# Patient Record
Sex: Female | Born: 1970 | Race: White | Hispanic: No | Marital: Married | Smoking: Never smoker
Health system: Southern US, Community
[De-identification: ages and names within clinical notes are randomized; demographics above are authoritative.]

## PROBLEM LIST (undated history)

## (undated) DIAGNOSIS — R51 Headache: Secondary | ICD-10-CM

## (undated) DIAGNOSIS — M8580 Other specified disorders of bone density and structure, unspecified site: Secondary | ICD-10-CM

## (undated) DIAGNOSIS — R519 Headache, unspecified: Secondary | ICD-10-CM

## (undated) DIAGNOSIS — M1612 Unilateral primary osteoarthritis, left hip: Secondary | ICD-10-CM

## (undated) DIAGNOSIS — IMO0002 Reserved for concepts with insufficient information to code with codable children: Secondary | ICD-10-CM

## (undated) DIAGNOSIS — J302 Other seasonal allergic rhinitis: Secondary | ICD-10-CM

## (undated) HISTORY — PX: WISDOM TOOTH EXTRACTION: SHX21

## (undated) HISTORY — DX: Unilateral primary osteoarthritis, left hip: M16.12

## (undated) HISTORY — PX: OTHER SURGICAL HISTORY: SHX169

## (undated) HISTORY — DX: Other seasonal allergic rhinitis: J30.2

## (undated) HISTORY — DX: Headache: R51

## (undated) HISTORY — DX: Headache, unspecified: R51.9

## (undated) HISTORY — DX: Other specified disorders of bone density and structure, unspecified site: M85.80

## (undated) HISTORY — DX: Reserved for concepts with insufficient information to code with codable children: IMO0002

---

## 1996-08-08 HISTORY — PX: BREAST ENHANCEMENT SURGERY: SHX7

## 1998-11-07 HISTORY — PX: BREAST ENHANCEMENT SURGERY: SHX7

## 2002-05-08 ENCOUNTER — Other Ambulatory Visit: Admission: RE | Admit: 2002-05-08 | Discharge: 2002-05-08 | Payer: Self-pay | Admitting: *Deleted

## 2003-09-18 ENCOUNTER — Other Ambulatory Visit: Admission: RE | Admit: 2003-09-18 | Discharge: 2003-09-18 | Payer: Self-pay | Admitting: *Deleted

## 2003-12-16 ENCOUNTER — Encounter: Admission: RE | Admit: 2003-12-16 | Discharge: 2003-12-16 | Payer: Self-pay | Admitting: Sports Medicine

## 2003-12-16 ENCOUNTER — Encounter: Admission: RE | Admit: 2003-12-16 | Discharge: 2003-12-16 | Payer: Self-pay | Admitting: Family Medicine

## 2004-01-23 ENCOUNTER — Encounter: Admission: RE | Admit: 2004-01-23 | Discharge: 2004-01-23 | Payer: Self-pay | Admitting: Family Medicine

## 2004-11-22 ENCOUNTER — Other Ambulatory Visit: Admission: RE | Admit: 2004-11-22 | Discharge: 2004-11-22 | Payer: Self-pay | Admitting: *Deleted

## 2006-02-22 ENCOUNTER — Other Ambulatory Visit: Admission: RE | Admit: 2006-02-22 | Discharge: 2006-02-22 | Payer: Self-pay | Admitting: Obstetrics & Gynecology

## 2006-03-15 ENCOUNTER — Encounter: Admission: RE | Admit: 2006-03-15 | Discharge: 2006-03-15 | Payer: Self-pay | Admitting: Obstetrics & Gynecology

## 2006-08-08 DIAGNOSIS — R87619 Unspecified abnormal cytological findings in specimens from cervix uteri: Secondary | ICD-10-CM

## 2006-08-08 DIAGNOSIS — IMO0002 Reserved for concepts with insufficient information to code with codable children: Secondary | ICD-10-CM

## 2006-08-08 HISTORY — DX: Reserved for concepts with insufficient information to code with codable children: IMO0002

## 2006-08-08 HISTORY — DX: Unspecified abnormal cytological findings in specimens from cervix uteri: R87.619

## 2006-08-08 HISTORY — PX: TUBAL LIGATION: SHX77

## 2006-10-07 LAB — CONVERTED CEMR LAB: Pap Smear: NORMAL

## 2006-10-30 ENCOUNTER — Ambulatory Visit: Payer: Self-pay | Admitting: Internal Medicine

## 2006-10-30 LAB — CONVERTED CEMR LAB
ALT: 14 units/L (ref 0–40)
AST: 14 units/L (ref 0–37)
Albumin: 4.2 g/dL (ref 3.5–5.2)
Alkaline Phosphatase: 76 units/L (ref 39–117)
BUN: 11 mg/dL (ref 6–23)
Basophils Absolute: 0 10*3/uL (ref 0.0–0.1)
Basophils Relative: 0.4 % (ref 0.0–1.0)
Bilirubin, Direct: 0.1 mg/dL (ref 0.0–0.3)
CO2: 32 meq/L (ref 19–32)
Calcium: 9 mg/dL (ref 8.4–10.5)
Chloride: 106 meq/L (ref 96–112)
Cholesterol: 154 mg/dL (ref 0–200)
Creatinine, Ser: 1 mg/dL (ref 0.4–1.2)
Eosinophils Absolute: 0.5 10*3/uL (ref 0.0–0.6)
Eosinophils Relative: 8.5 % — ABNORMAL HIGH (ref 0.0–5.0)
GFR calc Af Amer: 81 mL/min
GFR calc non Af Amer: 67 mL/min
Glucose, Bld: 87 mg/dL (ref 70–99)
HCT: 40.4 % (ref 36.0–46.0)
HDL: 45.7 mg/dL (ref >39.0)
Hemoglobin: 14 g/dL (ref 12.0–15.0)
LDL Cholesterol: 99 mg/dL (ref 0–99)
Lymphocytes Relative: 28 % (ref 12.0–46.0)
MCHC: 34.6 g/dL (ref 30.0–36.0)
MCV: 89.2 fL (ref 78.0–100.0)
Monocytes Absolute: 0.4 10*3/uL (ref 0.2–0.7)
Monocytes Relative: 6.9 % (ref 3.0–11.0)
Neutro Abs: 3.3 10*3/uL (ref 1.4–7.7)
Neutrophils Relative %: 56.2 % (ref 43.0–77.0)
Platelets: 228 10*3/uL (ref 150–400)
Potassium: 4 meq/L (ref 3.5–5.1)
RBC: 4.53 M/uL (ref 3.87–5.11)
RDW: 11.7 % (ref 11.5–14.6)
Sodium: 142 meq/L (ref 135–145)
TSH: 1.29 microintl units/mL (ref 0.35–5.50)
Total Bilirubin: 0.8 mg/dL (ref 0.3–1.2)
Total CHOL/HDL Ratio: 3.4
Total Protein: 6.9 g/dL (ref 6.0–8.3)
Triglycerides: 47 mg/dL (ref 0–149)
VLDL: 9 mg/dL (ref 0–40)
WBC: 5.9 10*3/uL (ref 4.5–10.5)

## 2006-11-06 ENCOUNTER — Ambulatory Visit: Payer: Self-pay | Admitting: Internal Medicine

## 2006-11-22 ENCOUNTER — Encounter: Admission: RE | Admit: 2006-11-22 | Discharge: 2006-11-22 | Payer: Self-pay | Admitting: Plastic Surgery

## 2007-01-07 HISTORY — PX: BREAST SURGERY: SHX581

## 2007-03-27 ENCOUNTER — Other Ambulatory Visit: Admission: RE | Admit: 2007-03-27 | Discharge: 2007-03-27 | Payer: Self-pay | Admitting: Obstetrics & Gynecology

## 2007-05-01 ENCOUNTER — Ambulatory Visit (HOSPITAL_BASED_OUTPATIENT_CLINIC_OR_DEPARTMENT_OTHER): Admission: RE | Admit: 2007-05-01 | Discharge: 2007-05-01 | Payer: Self-pay | Admitting: Obstetrics & Gynecology

## 2007-05-01 ENCOUNTER — Encounter: Payer: Self-pay | Admitting: Obstetrics & Gynecology

## 2007-09-26 ENCOUNTER — Other Ambulatory Visit: Admission: RE | Admit: 2007-09-26 | Discharge: 2007-09-26 | Payer: Self-pay | Admitting: Obstetrics & Gynecology

## 2007-11-06 LAB — CONVERTED CEMR LAB: Pap Smear: NORMAL

## 2008-08-22 ENCOUNTER — Ambulatory Visit: Payer: Self-pay | Admitting: *Deleted

## 2008-08-22 DIAGNOSIS — J329 Chronic sinusitis, unspecified: Secondary | ICD-10-CM | POA: Insufficient documentation

## 2008-09-15 ENCOUNTER — Ambulatory Visit: Payer: Self-pay | Admitting: *Deleted

## 2008-09-15 DIAGNOSIS — F341 Dysthymic disorder: Secondary | ICD-10-CM | POA: Insufficient documentation

## 2008-10-15 ENCOUNTER — Ambulatory Visit: Payer: Self-pay | Admitting: *Deleted

## 2008-11-14 ENCOUNTER — Ambulatory Visit: Payer: Self-pay | Admitting: *Deleted

## 2010-09-07 NOTE — Assessment & Plan Note (Signed)
Summary: talk about depression   Vital Signs:  Patient Profile:   40 Years Old Female Height:     61.25 inches Weight:      138 pounds BMI:     25.96 O2 treatment:    Room Air Temp:     98.0 degrees F oral Pulse rate:   84 / minute Pulse rhythm:   regular Resp:     20 per minute BP sitting:   110 / 70 Cuff size:   regular  Vitals Entered By: Darra Lis RMA (September 15, 2008 9:36 AM)  Menstrual History: LMP - Character: ablation                 Visit Type:  acute PCP:  Paulo Fruit MD  Chief Complaint:  Depression and Depressive symptoms.  History of Present Illness: Patient c/o depression/anxiety. Patient not enjoying the things she use to love to do. Past history of depression/anxiety - symptoms similar.    Depressive Symptoms      This is a 40 year old woman who presents with Depressive symptoms.  The patient reports depressed mood, loss of interest/pleasure, and hypersomnia, but denies significant weight loss, significant weight gain, insomnia, psychomotor agitation, and psychomotor retardation.  The patient also reports fatigue or loss of energy, feelings of worthlessness, diminished concentration, and indecisiveness.  The patient denies thoughts of death, thoughts of suicide, suicidal intent, and suicidal plans.  Patient's past history includes depression.  The patient denies abnormally elevated mood, abnormally irritable mood, decreased need for sleep, increased talkativeness, distractibility, flight of ideas, increased goal-directed activity, and inflated self-esteem/ grandiosity.      Updated Prior Medication List: OMEGA-3 350 MG  CAPS (OMEGA-3 FATTY ACIDS)  MULTIVITAMINS   TABS (MULTIPLE VITAMIN)  * B COMPLEX  * CALCIUM - D  Current Allergies (reviewed today): No known allergies   Past Medical History:    G3 P3    recurrent sinusitis    depression/anxiety  Past Surgical History:    Reviewed history from 08/22/2008 and no changes required:  breast augmentation 12/2006       Tubal ligation (04/2007)     Review of Systems       no nausea / vomiting / diarrhea / fever / chills / chest pain / SOB    Physical Exam  General:     Well-developed,well-nourished, well-hydrated, in no acute distress Eyes:     No conjunctival inflammation or injection noted. EOMI. PERRLA. Vision grossly normal. Ears:     External ear exam shows no significant lesions or deformities.   Hearing is grossly normal bilaterally. Nose:     External nasal examination shows no deformity or inflammation.  Neck:     supple, full ROM  Lungs:     Normal respiratory effort, chest expands symmetrically. Lungs are clear to auscultation, no crackles, rales  or wheezes. normal breath sounds and good air flow.   Heart:     Normal rate and regular rhythm. S1 and S2 normal without gallop, murmur, click, rub or other extra sounds. Extremities:     No clubbing, cyanosis, edema, or deformity noted with grossly normal full range of motion of all joints.   Neurologic:     alert & oriented X3 and gait normal.  no deficit in strength noted, no balance problems noted, neuro is grossly intact Skin:     Intact without suspicious lesions or rashes Psych:     Cognition and judgment appear intact. Alert and cooperative, normally interactive.  blunted affect noted     Impression & Recommendations:  Problem # 1:  DEPRESSION/ANXIETY (ICD-300.4) Patient with past history of similar symptoms.  patient denies suicidal/homocidal thoughts/plans.  safety contract made.  will start her on effexor and follow up in one month.  Patient to call if symptoms increase / change / persist or any concerns.    Had labs not long ago including TSH, CBC - reported as within normal limits    Complete Medication List: 1)  Omega-3 350 Mg Caps (Omega-3 fatty acids) 2)  Multivitamins Tabs (Multiple vitamin) 3)  B Complex  4)  Calcium - D  5)  Venlafaxine Hcl 37.5 Mg Tabs (Venlafaxine hcl)  .... One tab by mouth two times a day   Patient Instructions: 1)  Please schedule a follow-up appointment in 1 month.   Prescriptions: VENLAFAXINE HCL 37.5 MG TABS (VENLAFAXINE HCL) one tab by mouth two times a day  #60 x 1   Entered and Authorized by:   Paulo Fruit MD   Signed by:   Paulo Fruit MD on 09/15/2008   Method used:   Electronically to        [facility] [address] (retail)       [phone]       [place], [place]  [address]       Ph: [phone]       Fax: [phone]   RxID:   [id number]

## 2010-09-07 NOTE — Assessment & Plan Note (Signed)
Summary: 1 month rov-ch   Vital Signs:  Patient profile:   40 year old female Weight:      136.50 pounds BMI:     25.67 Temp:     98.4 degrees F oral Pulse rate:   70 / minute Pulse rhythm:   regular Resp:     16 per minute BP sitting:   100 / 60  (right arm) Cuff size:   regular  Vitals Entered By: Glendell Docker CMA (November 14, 2008 1:15 PM)  History of Present Illness: patient with newly diagnosis depression 09/15/08.  she was started on venlafaxine 37.5mg  two times a day and did well, but felt that there were still some minimal, residual symptoms.  She was increased to 75mg  two times a day at the last appointment, but she feels sleepy on the higher dose.  "I wish there was a dose right between the two."  We will try 37.5mg  - 2 in the am and 1 in the pm - for  a total of 3 / day.  She is agreeable.  no suicidal/homocidal thoughts/plans.    no other concerns.  Medications Prior to Update: 1)  Multivitamins   Tabs (Multiple Vitamin) 2)  B Complex 3)  Calcium - D 4)  Venlafaxine Hcl 75 Mg Tabs (Venlafaxine Hcl) .[name].. 1 By Mouth Two Times A Day  Allergies (verified): No Known Drug Allergies  Past History:  Past Medical History:    Reviewed history from 09/15/2008 and no changes required:    G3 P3    recurrent sinusitis    depression/anxiety  Past Surgical History:    Reviewed history from 08/22/2008 and no changes required:    breast augmentation 12/2006    Tubal ligation (04/2007)  Review of Systems       no nausea / vomiting / diarrhea / fever / chills / chest pain / SOB   Physical Exam  Additional Exam:  General:     Well-developed,well-nourished, well-hydrated, in no acute distress Eyes:     No conjunctival inflammation or injection noted. EOMI. PERRLA. Vision grossly normal. Ears:     External ear exam shows no significant lesions or deformities.   Hearing is grossly normal bilaterally. Nose:     External nasal examination shows no deformity or inflammation.    Neck:     supple, full ROM  Lungs:     Normal respiratory effort, chest expands symmetrically. Lungs are clear to auscultation, no crackles, rales  or wheezes. normal breath sounds and good air flow.   Heart:     Normal rate and regular rhythm. S1 and S2 normal without gallop, murmur, click, rub or other extra sounds. Extremities:     No clubbing, cyanosis, edema, or deformity noted with grossly normal full range of motion of all joints.   Neurologic:     alert & oriented X3 and gait normal.  no deficit in strength noted, no balance problems noted, neuro is grossly intact Skin:     Intact without suspicious lesions or rashes Psych:     Cognition and judgment appear intact. Alert and cooperative, normally interactive.  brighter  affect noted    Impression & Recommendations:  Problem # 1:  DEPRESSION/ANXIETY (ICD-300.4) venlafaxine 37.5mg  - 2 in am and 1 in pm - for a total of 3 / day. see HPI for discussion. follow up in 3 months Patient to call if symptoms increase / change / persist or any concerns.   Complete Medication List: 1)  Multivitamins Tabs (Multiple vitamin) 2)  B Complex  3)  Calcium - D  4)  Venlafaxine Hcl 37.5 Mg Tabs (Venlafaxine hcl) .... Two tabs by mouth every am, and one tab by mouth every pm  Patient Instructions: 1)  Please schedule a follow-up appointment in 3 months .  Prescriptions: VENLAFAXINE HCL 37.5 MG TABS (VENLAFAXINE HCL) two tabs by mouth every am, and one tab by mouth every pm  #90 x 3   Entered and Authorized by:   Paulo Fruit MD   Signed by:   Paulo Fruit MD on 11/14/2008   Method used:   Electronically to        [facility] [address] (retail)       [phone]       [place], [place]  [address]       Ph: [phone]       Fax: [phone]   RxID:   [id number]       Current Allergies (reviewed today): No known allergies

## 2010-09-07 NOTE — Assessment & Plan Note (Signed)
Summary: 1 months rov-ch   Vital Signs:  Patient profile:   40 year old female Height:      61.25 inches Weight:      135 pounds BMI:     25.39 Temp:     98.1 degrees F oral Pulse rate:   72 / minute Pulse rhythm:   regular Resp:     20 per minute BP sitting:   102 / 62  (right arm) Cuff size:   regular  Vitals Entered By: Darra Lis RMA (October 15, 2008 1:07 PM) CC: follow up   History of Present Illness: Patient is doing well on the Effexor but thinks she needs to go up on the dose.  no complaints of side effects.  Depressive symptoms much better, but not fully relieved.  no suicidal/homocidal thoughts/plans.     Allergies (verified): No Known Drug Allergies  Past Medical History:    Reviewed history from 09/15/2008 and no changes required:       G3 P3       recurrent sinusitis       depression/anxiety  Past Surgical History:    Reviewed history from 08/22/2008 and no changes required:       breast augmentation 12/2006       Tubal ligation (04/2007)  Review of Systems       no nausea / vomiting / diarrhea / fever / chills / chest pain / SOB   Physical Exam  Additional Exam:  General:     Well-developed,well-nourished, well-hydrated, in no acute distress Eyes:     No conjunctival inflammation or injection noted. EOMI. PERRLA. Vision grossly normal. Ears:     External ear exam shows no significant lesions or deformities.   Hearing is grossly normal bilaterally. Nose:     External nasal examination shows no deformity or inflammation.  Neck:     supple, full ROM  Lungs:     Normal respiratory effort, chest expands symmetrically. Lungs are clear to auscultation, no crackles, rales  or wheezes. normal breath sounds and good air flow.   Heart:     Normal rate and regular rhythm. S1 and S2 normal without gallop, murmur, click, rub or other extra sounds. Extremities:     No clubbing, cyanosis, edema, or deformity noted with grossly normal full range of motion of  all joints.   Neurologic:     alert & oriented X3 and gait normal.  no deficit in strength noted, no balance problems noted, neuro is grossly intact Skin:     Intact without suspicious lesions or rashes Psych:     Cognition and judgment appear intact. Alert and cooperative, normally interactive.  brighter  affect noted    Impression & Recommendations:  Problem # 1:  DEPRESSION/ANXIETY (ICD-300.4) will increase meds and follow up in one month  Complete Medication List: 1)  Multivitamins Tabs (Multiple vitamin) 2)  B Complex  3)  Calcium - D  4)  Venlafaxine Hcl 75 Mg Tabs (Venlafaxine hcl) .[name].. 1 by mouth two times a day  Patient Instructions: 1)  Please schedule a follow-up appointment in 1 month.  2)  The medication list was reviewed and reconciled.  All changed / newly prescribed medications were explained.  A complete medication list was provided to the patient / caregiver. Prescriptions: VENLAFAXINE HCL 75 MG TABS (VENLAFAXINE HCL) 1 by mouth two times a day  #60 x 1   Entered and Authorized by:   Paulo Fruit MD   Signed by:  Paulo Fruit MD on 10/15/2008   Method used:   Electronically to        [facility] [address] (retail)       [phone]       [place], [place]  [address]       Ph: [phone]       Fax: [phone]   RxID:   [id number]

## 2010-09-07 NOTE — Assessment & Plan Note (Signed)
Summary: NEW PT TO BE EST. & ? SINUS INFECTION   Vital Signs:  Patient Profile:   40 Years Old Female Height:     61.25 inches Weight:      138.12 pounds BMI:     25.98 Temp:     98.1 degrees F oral Pulse rate:   60 / minute Pulse rhythm:   regular Resp:     20 per minute BP sitting:   100 / 70  (right arm) Cuff size:   regular  Vitals Entered By: Shary Decamp (August 22, 2008 2:10 PM)                 Visit Type:  new patient - acute PCP:  Paulo Fruit MD  Chief Complaint:  sinusitis and URI symptoms.  History of Present Illness: Patient is usually seen at another [place] facility, but is transferring here.  patient started with cold sxs 2 weeks ago -- now c/o of increasing sinus pain & pressure for past 3 days ; using benadryl -- tyl cold.  patient with history of recurrent sinusitis - z-paks have worked well in the past.  The patient reports nasal congestion, purulent nasal discharge, and earache, but denies sore throat, dry cough, and productive cough.  The patient denies fever, stiff neck, dyspnea, wheezing, rash, vomiting, and diarrhea.  The patient also reports sneezing and headache.  The patient denies itchy watery eyes, itchy throat, muscle aches, and severe fatigue.      Updated Prior Medication List: OMEGA-3 350 MG  CAPS (OMEGA-3 FATTY ACIDS)  MULTIVITAMINS   TABS (MULTIPLE VITAMIN)  * B COMPLEX  * CALCIUM - D   Current Allergies (reviewed today): No known allergies   Past Medical History:    G3 P3    recurrent sinusitis  Past Surgical History:    breast augmentation 12/2006    Tubal ligation (04/2007)   Family History:    Family History of  colon cancer    Family History of  diabetes  Social History:    Occupation: Mudlogger    Married    3 children    Former Smoker    Alcohol use-no   Risk Factors:  Tobacco use:  quit    Year quit:  1991    Pack-years:  2 years 1/2 ppd Passive smoke exposure:  yes Drug use:   no HIV high-risk behavior:  no Caffeine use:  2 drinks per day Alcohol use:  no Exercise:  yes    Times per week:  5    Type:  cardio, weights Seatbelt use:  100 % Sun Exposure:  frequently - uses sun block  Family History Risk Factors:    Family History of MI in females < 65 years old:  no    Family History of MI in males < 40 years old:  no  PAP Smear History:     Date of Last PAP Smear:  11/06/2007    Results:  Normal   Mammogram History:     Date of Last Mammogram:  12/07/2006    Results:  normal   PAP Smear History:     Date of Last PAP Smear:  10/07/2006    Results:  normal    Review of Systems       no nausea / vomiting / diarrhea / fever / chills / chest pain / SOB    Physical Exam  General:     Well-developed,well-nourished, well-hydrated, in no acute distress,  tired/ill  appearing, but non-toxic Eyes:     No conjunctival inflammation or injection noted. EOMI. PERRLA. Vision grossly normal. Ears:     External ear exam shows no significant lesions or deformities.  Otoscopic examination reveals clear canals, tympanic membranes are intact bilaterally without bulging, retraction, inflammation or discharge. Hearing is grossly normal bilaterally. Nose:     External nasal examination shows no deformity or inflammation. Nasal mucosa are pink and moist without lesions or exudates. pain to palpation of the sinuses Mouth:     Oral mucosa and oropharynx without lesions, erythema or exudates.  some  postnasal drip Neck:     supple, full ROM  Lungs:     Normal respiratory effort, chest expands symmetrically. Lungs are clear to auscultation, no crackles, rales  or wheezes. normal breath sounds and good air flow.   Heart:     Normal rate and regular rhythm. S1 and S2 normal without gallop, murmur, click, rub or other extra sounds. Extremities:     No clubbing, cyanosis, edema, or deformity noted with grossly normal full range of motion of all joints.   Neurologic:      alert & oriented X3 and gait normal.  no deficit in strength noted, no balance problems noted, neuro is grossly intact Skin:     Intact without suspicious lesions or rashes Psych:     Cognition and judgment appear intact. Alert and cooperative, normally interactive     Impression & Recommendations:  Problem # 1:  SINUSITIS (ICD-473.9) will treat with z-pak since has worked well in the past.  also gave [employer]-pot handout.  Get plenty of rest, drink lots of clear liquids, and use Tylenol or Ibuprofen for fever and comfort. If sinus symptoms, then use warm moist compresses, and over the counter decongestants (only as directed). Call if no improvement in 5-7 days, sooner if increasing pain, fever, or new symptoms.  Her updated medication list for this problem includes:    Zithromax Z-pak 250 Mg Tabs (Azithromycin) .[name]... As directed   Complete Medication List: 1)  Omega-3 350 Mg Caps (Omega-3 fatty acids) 2)  Multivitamins Tabs (Multiple vitamin) 3)  B Complex  4)  Calcium - D  5)  Zithromax Z-pak 250 Mg Tabs (Azithromycin) .... As directed    Prescriptions: ZITHROMAX Z-PAK 250 MG TABS (AZITHROMYCIN) as directed  #1 x 0   Entered and Authorized by:   Paulo Fruit MD   Signed by:   Paulo Fruit MD on 08/22/2008   Method used:   Electronically to        [facility] [address] (retail)       [phone]       [place], [place]  [address]       Ph: [phone]       Fax: [phone]   RxID:   [id number]    Preventive Care Screening  Mammogram:    Date:  12/07/2006    Results:  normal   Pap Smear:    Date:  10/07/2006    Results:  normal

## 2010-12-21 NOTE — Op Note (Signed)
NAMEDEONNA, KRUMMEL                ACCOUNT NO.:  [id number]   MEDICAL RECORD NO.:  [id number]        PATIENT TYPE:  HAMB   LOCATION:                               FACILITY:  [facility]   PHYSICIAN:  M. Leda Quail, MD  DATE OF BIRTH:  Feb 02, 1971   DATE OF PROCEDURE:  05/01/2007  DATE OF DISCHARGE:                               OPERATIVE REPORT   PREOPERATIVE DIAGNOSES:  53. A 40 year old G2, P2 white female with history of desired      sterility.  2. Dysmenorrhea with current Mirena intrauterine contraceptive device      in place.  3. History of dysfunctional uterine bleeding in the past.   POSTOPERATIVE DIAGNOSES:  67. A 40 year old G2, P2 white female with history of desired      sterility.  2. Dysmenorrhea with current Mirena intrauterine contraceptive device      in place.  3. History of dysfunctional uterine bleeding in the past.   PROCEDURES:  1. Endometrial biopsy and Mirena intrauterine contraceptive device      removal.  2. HTA.  3. Laparoscopic bilateral tubal ligation with Filshie clips.   SURGEON:  M. Leda Quail, MD   ASSISTANT:  OR staff.   ANESTHESIA:  General endotracheal.   FINDINGS:  Normal uterus, tubes, ovaries, liver, and gallbladder.   SPECIMENS:  Endometrial biopsy to pathology.   ESTIMATED BLOOD LOSS:  Minimal.   FLUIDS:  1200 mL of LR.   URINE OUTPUT:  50 mL drained with a Foley catheter at the beginning of  the procedure.   COMPLICATIONS:  None.   INDICATIONS:  Ms. Rana Snare is a very nice 40 year old G2 P2 female who has a  history of long-term Depo-Provera use.  She initially started this due  to a history of DUB, and desired cycle regulation birth control.  She  has been on Depo-Provera since 1998.  About a year ago we decided to  discontinue the Depo-Provera and to use a Mirena IUD for birth control.  She really likes the amenorrhea that she has had with the Depo-Provera,  and I felt the Mirena was the next best option for her.  This  was placed  but since that time she has had continued to have pelvic pain and  cramping.  She does not have endometriosis, and she has waited a  completely appropriate amount of time to see if this will again improve.  She desired to have the Mirena IUD and did not desire going back on the  Depo-Provera.  She likes the way she feels without it better.  She does  not want to use birth control pills; and,  therefore, we talked about  permanent sterilization.  She was ready to proceed with this, but did  not want to have any new bleeding if at all possible.  Because of her  history, we talked about doing an endometrial ablation.  She educated  herself with the material  I provided, and with office counseling, and  decided to go ahead and proceed with both surgical interventions at the  same time.  The patient has had  a preop visit in the clinic with risks  and benefits explained, and she presents today for IUD removal  endometrial biopsy, HTA, and laparoscopic tubal ligation.   DESCRIPTION OF PROCEDURE:  The patient is taken to the operating room.  She is placed in the supine position.  General endotracheal anesthesia  is administered by the anesthesia staff without difficulty.  The left  arm is tucked.  Shoulders ports are added to the top the table for when  the patient is placed in Trendelenburg.  Her legs were then positioned  in the low lithotomy position in [place] stirrups.  Abdomen, perineum,  inner thighs, and vagina were prepped and draped in normal sterile  fashion.  A red rubber Foley catheter is used to drain the bladder of  all urine.   The legs are then lifted to the high lithotomy position, and attention  is turned toward the vagina.  A bivalve speculum was placed in the  vagina.  The anterior lip of the cervix was grasped with a single-tooth  tenaculum.  Then 10 mL of 1% lidocaine is instilled circumferentially  around the cervix at the 3, 6, 9, and 12 o'clock positions.   The uterus  then sounds to about 7.5 cm.  Pratt dilators are used to dilate the  cervix up to a #21.  Attempt is then made to pass the hysteroscope with  the HCA apparatus attached.  This did not pass,  and the cervix was  dilated up to a #23.   The cervix had a very rubbery consistency, and I was doubtful that  dilation to #21 would work.  Once the #23 [facility] dilator was placed  cervically, the hysteroscope could be slowly passed through the cervix.  There did appear to be excellent seal present.  Photo documentation of  the uterine cavity and tubal ostia were performed.  Hysteroscopic media  for this procedure was LR.   Once the scope was placed in the correct positioning within the cavity.  The hysteroscopic fluid was then heated slowly over 2 minutes to 90-  degrees Celsius.  The reservoir was completely stable during this  process.  Once the fluid was heated to about 90-degrees Celsius, a 10-  minute ablation cycle was performed.  There was no fluid leaking at all  from the cervix.  There was a very slow 10 mL of fluid leak over a 7-1/2  minutes time frame.  I decided to go ahead and complete the ablation  cycle, because I did not feel that this was coming from the cervix.  The  uterine cavity did relax during the ablation cycle, and I felt that it  was primarily due to this.  The full 10-minute ablation was performed  with the reservoir being completely stable for the last 2-1/2 minutes.  Fluid loss for the entire procedure was 10 mL.   Once the ablation cycle was complete, good blanching of the endometrial  tissue was noted.  Photo documentation was made.  Cool cycle was  performed, and then the hysteroscope was removed.  Tenaculum was removed  for the anterior lip of the cervix, and the speculum was removed from  vagina.  A sponge stick was then placed in the vagina to lift the uterus  for the laparoscopic portion of the procedure.  The legs were then  lowered.  At this point,  sponge, lap, and instrument counts were correct  x2.  There were no needles used on the field at this  point.   Attention was turned to the umbilicus, 3 mL of 1/4% Marcaine were  instilled beneath the umbilicus.  A 10-mm skin incision was made with  the #11 blade.  The subcu fat tissue was dissected with the hemostat.  The sacral promontory was palpated.  The abdominal wall was then lifted  and a Veress needle was placed through the abdominal wall layers aiming  toward the pelvis below the level of the sacral promontory.  Normal  saline was attached to the Veress needle, and aspiration was performed  without any blood or fluid noted.  Then an injection was performed, and  then an aspiration, again, was performed without any blood fluid or  saline being aspirated.   CO2 gas was attached to the Veress needle under low flow and a  pneumoperitoneum was achieved without difficulty.  Pressures remained  under 12 mmHg, and were mostly under 8 mmHg until the very end when  there was approximately 2-1/2 liters of gas in the abdomen.  The Veress  needle was then removed..  The abdominal wall was, again, grasped and an  OptiVu port attached to the straight laparoscope was then advanced into  the abdomen using a twisting motion to divide the abdominal wall layers.  Once intraperitoneal placement was confirmed, a straight scope was  changed to an operative scope.   The uterus was lifted, the fallopian tubes and ovaries were noted  bilaterally.  The upper abdomen was surveyed.  There was no  abnormalities noted.  Then a Filshie clip was placed on both the right-  and-left fallopian tubes in the isthmic region without difficulty.  Photo documentation was made of this.  While this portion of the  procedure was performed, the patient was in some mild Trendelenburg.  The Trendelenburg position was then relieved.  The laparoscope was  removed, and the pneumoperitoneum was completely relieved.  Several   breaths from the anesthesiologist were given to the patient to try and  get all of the gas of the abdomen.   Then the port was removed, and the operator's index finger was placed in  the incision to ensure that there was no bowel present.  The fascia was  then grasped on each side with a Kocher clamp.  A figure-of-eight suture  of #0 Vicryl was used to close the fascial incision at the umbilicus.  Then a subcuticular stitch of 3-0 Vicryl was used to close the skin.  The incision was cleansed, Dermabond was placed on the incision.   Sponge, lap, needle, and instrument counts were correct x2.  The patient  tolerated the procedure very well.  She was positioned, placed back in  the supine position, with her legs removed from the Joshua stirrups; and  she was awakened from anesthesia without difficulty.  The patient  tolerated the procedure well, and she was taken to the recovery room in  stable condition.      Lum Keas, MD  Electronically Signed     MSM/MEDQ  D:  05/01/2007  T:  05/02/2007  Job:  [id number]

## 2011-05-19 LAB — URINE MICROSCOPIC-ADD ON

## 2011-05-19 LAB — URINALYSIS, ROUTINE W REFLEX MICROSCOPIC
Bilirubin Urine: NEGATIVE
Glucose, UA: NEGATIVE
Hgb urine dipstick: NEGATIVE
Ketones, ur: NEGATIVE
Nitrite: POSITIVE — AB
Protein, ur: NEGATIVE
Specific Gravity, Urine: 1.018
Urobilinogen, UA: 0.2
pH: 5.5

## 2011-05-19 LAB — CBC
HCT: 35.7 — ABNORMAL LOW
Hemoglobin: 12.3
MCHC: 34.5
MCV: 89.1
Platelets: 246
RBC: 4.01
RDW: 11.7
WBC: 8.4

## 2011-05-19 LAB — PREGNANCY, URINE: Preg Test, Ur: NEGATIVE

## 2013-04-26 ENCOUNTER — Encounter: Payer: Self-pay | Admitting: Obstetrics & Gynecology

## 2013-04-29 ENCOUNTER — Ambulatory Visit (INDEPENDENT_AMBULATORY_CARE_PROVIDER_SITE_OTHER): Payer: Managed Care, Other (non HMO) | Admitting: Obstetrics & Gynecology

## 2013-04-29 ENCOUNTER — Encounter: Payer: Self-pay | Admitting: Obstetrics & Gynecology

## 2013-04-29 VITALS — BP 100/60 | HR 60 | Resp 16 | Ht 61.5 in | Wt 130.4 lb

## 2013-04-29 DIAGNOSIS — Z01419 Encounter for gynecological examination (general) (routine) without abnormal findings: Secondary | ICD-10-CM

## 2013-04-29 DIAGNOSIS — Z124 Encounter for screening for malignant neoplasm of cervix: Secondary | ICD-10-CM

## 2013-04-29 DIAGNOSIS — N83209 Unspecified ovarian cyst, unspecified side: Secondary | ICD-10-CM

## 2013-04-29 DIAGNOSIS — Z0000 Encounter for general adult medical examination without abnormal findings: Secondary | ICD-10-CM

## 2013-04-29 LAB — HEMOGLOBIN, FINGERSTICK: Hemoglobin, fingerstick: 12.6 g/dL (ref 12.0–16.0)
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
## 2013-04-29 NOTE — Progress Notes (Signed)
PUS scheduled for 05-02-13.

## 2013-04-29 NOTE — Patient Instructions (Signed)

## 2013-04-30 ENCOUNTER — Telehealth: Payer: Self-pay | Admitting: Obstetrics & Gynecology

## 2013-04-30 LAB — COMPREHENSIVE METABOLIC PANEL
ALT: 33 U/L (ref 0–35)
AST: 37 U/L (ref 0–37)
Albumin: 4.2 g/dL (ref 3.5–5.2)
Alkaline Phosphatase: 58 U/L (ref 39–117)
BUN: 12 mg/dL (ref 6–23)
CO2: 29 mEq/L (ref 19–32)
Calcium: 8.9 mg/dL (ref 8.4–10.5)
Chloride: 102 mEq/L (ref 96–112)
Creat: 0.8 mg/dL (ref 0.50–1.10)
Glucose, Bld: 90 mg/dL (ref 70–99)
Potassium: 3.8 mEq/L (ref 3.5–5.3)
Sodium: 137 mEq/L (ref 135–145)
Total Bilirubin: 0.3 mg/dL (ref 0.3–1.2)
Total Protein: 6.7 g/dL (ref 6.0–8.3)

## 2013-04-30 LAB — LIPID PANEL
Cholesterol: 157 mg/dL (ref 0–200)
HDL: 66 mg/dL (ref >39)
LDL Cholesterol: 76 mg/dL (ref 0–99)
Total CHOL/HDL Ratio: 2.4 Ratio
Triglycerides: 74 mg/dL (ref <150)
VLDL: 15 mg/dL (ref 0–40)

## 2013-04-30 LAB — VITAMIN D 25 HYDROXY (VIT D DEFICIENCY, FRACTURES): Vit D, 25-Hydroxy: 55 ng/mL (ref 30–89)

## 2013-04-30 LAB — TSH: TSH: 0.753 u[IU]/mL (ref 0.350–4.500)

## 2013-04-30 NOTE — Telephone Encounter (Signed)
LMTCB to move PUS appt up.  °

## 2013-05-01 ENCOUNTER — Telehealth: Payer: Self-pay | Admitting: Obstetrics & Gynecology

## 2013-05-01 NOTE — Telephone Encounter (Signed)
LMTCB (mobile number) advised PUS for 9/25 has been cancelled and to call back to r/s. Advised that 9am appointment is available tmro.  Home number rang busy.  LVM on work number.

## 2013-05-02 ENCOUNTER — Other Ambulatory Visit: Payer: Self-pay

## 2013-05-02 ENCOUNTER — Other Ambulatory Visit: Payer: Self-pay | Admitting: Obstetrics & Gynecology

## 2013-05-02 LAB — IPS PAP SMEAR ONLY

## 2013-05-02 NOTE — Telephone Encounter (Signed)
Spoke with patient and rescheduled PUS for 9/30.

## 2013-05-07 ENCOUNTER — Encounter: Payer: Self-pay | Admitting: Obstetrics & Gynecology

## 2013-05-07 ENCOUNTER — Ambulatory Visit (INDEPENDENT_AMBULATORY_CARE_PROVIDER_SITE_OTHER): Payer: Managed Care, Other (non HMO) | Admitting: Obstetrics & Gynecology

## 2013-05-07 ENCOUNTER — Ambulatory Visit (INDEPENDENT_AMBULATORY_CARE_PROVIDER_SITE_OTHER): Payer: Managed Care, Other (non HMO)

## 2013-05-07 DIAGNOSIS — N83209 Unspecified ovarian cyst, unspecified side: Secondary | ICD-10-CM

## 2013-05-07 DIAGNOSIS — N8000 Endometriosis of the uterus, unspecified: Secondary | ICD-10-CM

## 2013-05-07 DIAGNOSIS — N8003 Adenomyosis of the uterus: Secondary | ICD-10-CM

## 2013-05-07 NOTE — Progress Notes (Signed)
42 y.o.Marriedfemale here for a pelvic ultrasound due to enlarged left ovary palpated with AEX on 04/29/13.  Pt denies pain.  No vaginal bleeding.  No complaints.  Patient's last menstrual period was 08/08/2005.  Sexually active:  yes  Contraception: bilateral tubal ligation  FINDINGS: UTERUS: 7.7 x 4.8 x 3.6cm with adenomyosis appearing endometrium EMS: 2.87mm ADNEXA:   Left ovary 3.6 x 2.5 x 2.1cm with 2cm corpus luteal cyst   Right ovary 3.6 x 2.5 x 2.1cm with 2 simple cysts CUL DE SAC: no free fluid   Images reviewed with patient.  Discussed findings of cyst and corpus luteal cyst.  All questions answered.  Assessment:  Adenomyosis and corpus luteal cyst Plan: F/U 1 year for AEX and prn new problems  ~15 minutes spent with patient >50% of time was in face to face discussion of above.

## 2013-10-06 HISTORY — PX: OTHER SURGICAL HISTORY: SHX169

## 2013-10-06 HISTORY — PX: VAGINA RECONSTRUCTION SURGERY: SHX828

## 2013-12-04 ENCOUNTER — Ambulatory Visit: Payer: Self-pay | Admitting: Physician Assistant

## 2013-12-04 DIAGNOSIS — Z0289 Encounter for other administrative examinations: Secondary | ICD-10-CM

## 2014-06-09 ENCOUNTER — Encounter: Payer: Self-pay | Admitting: Obstetrics & Gynecology

## 2014-09-08 ENCOUNTER — Telehealth: Payer: Self-pay | Admitting: Nurse Practitioner

## 2014-11-25 ENCOUNTER — Ambulatory Visit (INDEPENDENT_AMBULATORY_CARE_PROVIDER_SITE_OTHER): Payer: Managed Care, Other (non HMO) | Admitting: Obstetrics & Gynecology

## 2014-11-25 ENCOUNTER — Encounter: Payer: Self-pay | Admitting: Obstetrics & Gynecology

## 2014-11-25 VITALS — BP 102/62 | HR 64 | Resp 12 | Ht 61.5 in | Wt 133.6 lb

## 2014-11-25 DIAGNOSIS — Z0000 Encounter for general adult medical examination without abnormal findings: Secondary | ICD-10-CM | POA: Diagnosis not present

## 2014-11-25 DIAGNOSIS — Z124 Encounter for screening for malignant neoplasm of cervix: Secondary | ICD-10-CM

## 2014-11-25 DIAGNOSIS — Z01419 Encounter for gynecological examination (general) (routine) without abnormal findings: Secondary | ICD-10-CM

## 2014-11-25 LAB — COMPREHENSIVE METABOLIC PANEL
ALT: 20 U/L (ref 0–35)
AST: 22 U/L (ref 0–37)
Albumin: 4.4 g/dL (ref 3.5–5.2)
Alkaline Phosphatase: 54 U/L (ref 39–117)
BUN: 16 mg/dL (ref 6–23)
CO2: 26 mEq/L (ref 19–32)
Calcium: 9.1 mg/dL (ref 8.4–10.5)
Chloride: 102 mEq/L (ref 96–112)
Creat: 0.9 mg/dL (ref 0.50–1.10)
Glucose, Bld: 98 mg/dL (ref 70–99)
Potassium: 4.2 mEq/L (ref 3.5–5.3)
Sodium: 138 mEq/L (ref 135–145)
Total Bilirubin: 0.2 mg/dL (ref 0.2–1.2)
Total Protein: 7 g/dL (ref 6.0–8.3)

## 2014-11-25 LAB — LIPID PANEL
Cholesterol: 164 mg/dL (ref 0–200)
HDL: 66 mg/dL (ref >=46)
LDL Cholesterol: 84 mg/dL (ref 0–99)
Total CHOL/HDL Ratio: 2.5 Ratio
Triglycerides: 72 mg/dL (ref <150)
VLDL: 14 mg/dL (ref 0–40)

## 2014-11-25 LAB — POCT URINALYSIS DIPSTICK
Bilirubin, UA: NEGATIVE
Glucose, UA: NEGATIVE
Ketones, UA: NEGATIVE
Nitrite, UA: NEGATIVE
Urobilinogen, UA: NEGATIVE
pH, UA: 5

## 2014-11-25 LAB — TSH: TSH: 1.307 u[IU]/mL (ref 0.350–4.500)

## 2014-11-25 NOTE — Progress Notes (Signed)
44 y.o. G3P3 MarriedCaucasianF here for annual exam.  Doing well.  Got married last year!!  Husband in [place].  He will be home in June.  Pt reports about a year ago.  Went to physician in [place].  Can't remember his name.  Has not had any bladder or bowel issues.  Had follow up monthly.    Continues to not have menstrual cycles.      No LMP recorded. Patient has had an ablation.          Sexually active: Yes.    The current method of family planning is tubal ligation.    Exercising: Yes.    weights and cardio Smoker:  no  Health Maintenance: Pap:  04/29/13 WNL History of abnormal Pap:  yes MMG:  4/08-normal Colonoscopy:  none BMD:   03/15/06-osteopenia TDaP:  11/06/06 Screening Labs: today, Hb today: 12.4, Urine today: WBC-1+, PROTEIN-trace, RBC-trace   reports that she has never smoked. She has never used smokeless tobacco. She reports that she drinks alcohol. She reports that she does not use illicit drugs.  Past Medical History  Diagnosis Date  . Headache   . Abnormal Pap smear   . Osteopenia     Past Surgical History  Procedure Laterality Date  . Cesarean section    . Breast enhancement surgery  4/00  . Breast surgery  6/08    revision on right breast due to tight muscles  . Hta      and BTL    Current Outpatient Prescriptions  Medication Sig Dispense Refill  . Calcium Carbonate (CALCIUM 600 PO) Take by mouth daily.    . cetirizine (ZYRTEC) 10 MG tablet Take 10 mg by mouth as needed.     . Multiple Vitamins-Minerals (MULTIVITAMIN PO) Take by mouth daily.    . Omega-3 Fatty Acids (FISH OIL PO) Take by mouth.     No current facility-administered medications for this visit.    Family History  Problem Relation Age of Onset  . Diabetes Paternal Grandmother   . Hypertension Paternal Grandmother   . Stroke Paternal Grandfather   . Congestive Heart Failure Maternal Grandfather   . Colon polyps Maternal Grandfather   . Colon polyps Mother   . Colon cancer  Mother 43    ROS:  Pertinent items are noted in HPI.  Otherwise, a comprehensive ROS was negative.  Exam:   BP 102/62 mmHg  Pulse 64  Resp 12  Ht 5' 1.5" (1.562 m)  Wt 133 lb 9.6 oz (60.601 kg)  BMI 24.84 kg/m2  Weight change: +3#   Height: 5' 1.5" (156.2 cm)  Ht Readings from Last 3 Encounters:  11/25/14 5' 1.5" (1.562 m)  04/29/13 5' 1.5" (1.562 m)  10/15/08 5' 1.25" (1.556 m)    General appearance: alert, cooperative and appears stated age Head: Normocephalic, without obvious abnormality, atraumatic Neck: no adenopathy, supple, symmetrical, trachea midline and thyroid normal to inspection and palpation Lungs: clear to auscultation bilaterally Breasts: normal appearance, no masses or tenderness, bilateral implants Heart: regular rate and rhythm Abdomen: soft, non-tender; bowel sounds normal; no masses,  no organomegaly Extremities: extremities normal, atraumatic, no cyanosis or edema Skin: Skin color, texture, turgor normal. No rashes or lesions Lymph nodes: Cervical, supraclavicular, and axillary nodes normal. No abnormal inguinal nodes palpated Neurologic: Grossly normal   Pelvic: External genitalia:  no lesions              Urethra:  normal appearing urethra with no masses, tenderness or lesions  Bartholins and Skenes: normal                 Vagina: normal appearing vagina with normal color and discharge, no lesions              Cervix: no lesions              Pap taken: Yes.   Bimanual Exam:  Uterus:  normal size, contour, position, consistency, mobility, non-tender              Adnexa: normal adnexa and no mass, fullness, tenderness               Rectovaginal: Confirms               Anus:  normal sphincter tone, no lesions  Chaperone was present for exam.  A:  Well Woman with normal exam H/O LGSIL pap 8/08 Osteopenia 8/07 Mother with hx of colon cancer, age 43 Recent [place] "[place] Dr. Rayetta Humphrey Bilateral breast  implants  P: Mammogram yearly. Information provided pap smear today with HR HPV TSH, CMP, Lipids, Vit D return annually or prn

## 2014-11-26 LAB — VITAMIN D 25 HYDROXY (VIT D DEFICIENCY, FRACTURES): Vit D, 25-Hydroxy: 57 ng/mL (ref 30–100)

## 2014-11-27 LAB — HEMOGLOBIN, FINGERSTICK: Hemoglobin, fingerstick: 12.4 g/dL (ref 12.0–16.0)

## 2014-11-27 LAB — IPS PAP TEST WITH HPV

## 2016-02-05 ENCOUNTER — Ambulatory Visit: Payer: Managed Care, Other (non HMO) | Admitting: Obstetrics & Gynecology

## 2016-04-04 ENCOUNTER — Encounter: Payer: Self-pay | Admitting: Obstetrics & Gynecology

## 2016-04-04 ENCOUNTER — Ambulatory Visit (INDEPENDENT_AMBULATORY_CARE_PROVIDER_SITE_OTHER): Payer: Managed Care, Other (non HMO) | Admitting: Obstetrics & Gynecology

## 2016-04-04 VITALS — BP 98/70 | HR 64 | Resp 12 | Ht 61.25 in | Wt 138.8 lb

## 2016-04-04 DIAGNOSIS — Z0000 Encounter for general adult medical examination without abnormal findings: Secondary | ICD-10-CM | POA: Diagnosis not present

## 2016-04-04 DIAGNOSIS — Z01419 Encounter for gynecological examination (general) (routine) without abnormal findings: Secondary | ICD-10-CM

## 2016-04-04 LAB — CBC
HCT: 38.8 % (ref 35.0–45.0)
Hemoglobin: 12.8 g/dL (ref 11.7–15.5)
MCH: 29.6 pg (ref 27.0–33.0)
MCHC: 33 g/dL (ref 32.0–36.0)
MCV: 89.6 fL (ref 80.0–100.0)
MPV: 9.6 fL (ref 7.5–12.5)
Platelets: 288 10*3/uL (ref 140–400)
RBC: 4.33 MIL/uL (ref 3.80–5.10)
RDW: 12.7 % (ref 11.0–15.0)
WBC: 7.7 10*3/uL (ref 3.8–10.8)

## 2016-04-04 LAB — POCT URINALYSIS DIPSTICK
Bilirubin, UA: NEGATIVE
Blood, UA: NEGATIVE
Glucose, UA: NEGATIVE
Ketones, UA: NEGATIVE
Leukocytes, UA: NEGATIVE
Nitrite, UA: NEGATIVE
Protein, UA: NEGATIVE
Urobilinogen, UA: NEGATIVE
pH, UA: 5

## 2016-04-04 NOTE — Progress Notes (Signed)
45 y.o. G3P3 MarriedCaucasianF here for annual exam.  Doing well.  No vaginal bleeding.  Moved into new home in January.  Husband was in [place] during that time.  He does contract work.    Went to [facility] [place]  Saw PA, Pharmacist, hospital.  Blood work was normal except progesterone was low.    No LMP recorded. Patient has had an ablation.          Sexually active: Yes.    The current method of family planning is tubal ligation.    Exercising: Yes.    Cardio and weight training   Smoker:  no  Health Maintenance: Pap:  11/25/2014 negative, HR HPV negative  History of abnormal Pap:  yes MMG:  2008 BIRADS 1 negative  Colonoscopy:  never BMD:   03/23/2006 osteopenia  TDaP:  11/06/2006  Pneumonia vaccine(s):  never Zostavax:   never Hep C testing: not indicated  Screening Labs: drawn today, Hb today: drawn today, Urine today: normal    reports that she has never smoked. She has never used smokeless tobacco. She reports that she drinks alcohol. She reports that she does not use drugs.  Past Medical History:  Diagnosis Date  . Abnormal Pap smear   . Headache   . Osteopenia     Past Surgical History:  Procedure Laterality Date  . BREAST ENHANCEMENT SURGERY  4/00  . BREAST SURGERY  6/08   revision on right breast due to tight muscles  . CESAREAN SECTION    . HTA     and BTL    Current Outpatient Prescriptions  Medication Sig Dispense Refill  . Calcium Carbonate (CALCIUM 600 PO) Take by mouth daily.    . cetirizine (ZYRTEC) 10 MG tablet Take 10 mg by mouth as needed.     . Multiple Vitamins-Minerals (MULTIVITAMIN PO) Take by mouth daily.    . Omega-3 Fatty Acids (FISH OIL PO) Take by mouth.     No current facility-administered medications for this visit.     Family History  Problem Relation Age of Onset  . Diabetes Paternal Grandmother   . Hypertension Paternal Grandmother   . Stroke Paternal Grandfather   . Congestive Heart Failure Maternal Grandfather    . Colon polyps Maternal Grandfather   . Colon polyps Mother   . Colon cancer Mother 13    ROS:  Pertinent items are noted in HPI.  Otherwise, a comprehensive ROS was negative.  Exam:   Vitals:   04/04/16 1306  BP: 98/70  Pulse: 64  Resp: 12  Weight: 138 lb 12.8 oz (63 kg)  Height: 5' 1.25" (1.556 m)   General appearance: alert, cooperative and appears stated age Head: Normocephalic, without obvious abnormality, atraumatic Neck: no adenopathy, supple, symmetrical, trachea midline and thyroid normal to inspection and palpation Lungs: clear to auscultation bilaterally Breasts: normal appearance, no masses or tenderness, bilateral implants present Heart: regular rate and rhythm Abdomen: soft, non-tender; bowel sounds normal; no masses,  no organomegaly Extremities: extremities normal, atraumatic, no cyanosis or edema Skin: Skin color, texture, turgor normal. No rashes or lesions Lymph nodes: Cervical, supraclavicular, and axillary nodes normal. No abnormal inguinal nodes palpated Neurologic: Grossly normal   Pelvic: External genitalia:  no lesions              Urethra:  normal appearing urethra with no masses, tenderness or lesions              Bartholins and Skenes: normal  Vagina: normal appearing vagina with normal color and discharge, no lesions              Cervix: no lesions              Pap taken: No. Bimanual Exam:  Uterus:  normal size, contour, position, consistency, mobility, non-tender              Adnexa: normal adnexa and no mass, fullness, tenderness               Rectovaginal: Confirms               Anus:  normal sphincter tone, no lesions  Chaperone was present for exam.  A:  Well Woman with normal exam H/O LGSIL pap 8/08 Osteopenia 8/07 Mother with hx of colon cancer, age 31 Vaginal "rejuvination surgery in [facility]", Dr. Rayetta Humphrey, 3/15 Bilateral breast implants  P: Mammogram recommended by ACS guidelines.  Information  provided. pap smear today with HR HPV 2016.  No pap today. CBC Copy of lab work from [facility] will be scanned into [employer]. return annually or prn

## 2017-07-13 ENCOUNTER — Other Ambulatory Visit (HOSPITAL_COMMUNITY)
Admission: RE | Admit: 2017-07-13 | Discharge: 2017-07-13 | Disposition: A | Discharge status: Home / Self Care | Payer: 59 | Source: Ambulatory Visit | Attending: Obstetrics & Gynecology | Admitting: Obstetrics & Gynecology

## 2017-07-13 ENCOUNTER — Other Ambulatory Visit: Payer: Self-pay

## 2017-07-13 ENCOUNTER — Encounter: Payer: Self-pay | Admitting: Obstetrics & Gynecology

## 2017-07-13 ENCOUNTER — Ambulatory Visit: Payer: 59 | Admitting: Obstetrics & Gynecology

## 2017-07-13 VITALS — BP 110/66 | HR 76 | Resp 14 | Ht 61.5 in | Wt 137.0 lb

## 2017-07-13 DIAGNOSIS — Z1211 Encounter for screening for malignant neoplasm of colon: Secondary | ICD-10-CM | POA: Diagnosis not present

## 2017-07-13 DIAGNOSIS — Z124 Encounter for screening for malignant neoplasm of cervix: Secondary | ICD-10-CM

## 2017-07-13 DIAGNOSIS — Z800 Family history of malignant neoplasm of digestive organs: Secondary | ICD-10-CM

## 2017-07-13 DIAGNOSIS — Z23 Encounter for immunization: Secondary | ICD-10-CM

## 2017-07-13 DIAGNOSIS — Z01419 Encounter for gynecological examination (general) (routine) without abnormal findings: Secondary | ICD-10-CM

## 2017-07-13 NOTE — Addendum Note (Signed)
Addended by: Megan Salon on: 07/13/2017 04:02 PM   Modules accepted: Orders

## 2017-07-13 NOTE — Progress Notes (Signed)
46 y.o. G3P3 MarriedCaucasianF here for annual exam.  Doing well.  No vaginal bleeding.  Being followed at [facility].  Does not need blood work.  Brought results with her.     Patient's last menstrual period was 04/09/2007.          Sexually active: Yes.    The current method of family planning is tubal ligation.    Exercising: Yes.    weights, cardio Smoker:  no  Health Maintenance: Pap:  11/25/14 Neg. HR HPV:neg   04/29/13 Neg  History of abnormal Pap: yes, 1993   MMG: 11/2006 BIRADS1:Neg  Colonoscopy:  Never  BMD:   03/2006 osteopenia  TDaP:  2008  Pneumonia vaccine(s):  No Zostavax:   No Hep C testing: No Screening Labs: PCP   reports that  has never smoked. she has never used smokeless tobacco. She reports that she drinks alcohol. She reports that she does not use drugs.  Past Medical History:  Diagnosis Date  . 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 Arthritis of left hip    saw Dr. [name], had MRI  . Headache   . Osteopenia     Past Surgical History:  Procedure Laterality Date  . BREAST ENHANCEMENT SURGERY  4/00  . BREAST SURGERY  6/08   revision on right breast due to tight muscles  . CESAREAN SECTION    . HTA     and BTL  . OTHER SURGICAL HISTORY  10/2013   Vaginal rejuvination surgery, Dr. [name], [place]    Current Outpatient Medications  Medication Sig Dispense Refill  . B Complex Vitamins (B COMPLEX PO) Take by mouth.    . Calcium Carbonate (CALCIUM 600 PO) Take by mouth daily.    . Coenzyme Q10 (CO Q 10 PO) Take by mouth.    . levocetirizine (XYZAL) 5 MG tablet     . meloxicam (MOBIC) 15 MG tablet     . Multiple Vitamins-Minerals (MULTIVITAMIN PO) Take by mouth daily.    [name] NATURE-THROID 32.5 MG tablet Take 32.5 mg by mouth every morning.  3  . NON FORMULARY Ashwaganda    . Omega-3 Fatty Acids (FISH OIL PO) Take by mouth.    . Probiotic Product (PROBIOTIC PO) Take by mouth.    . progesterone (PROMETRIUM) 100 MG capsule      No  current facility-administered medications for this visit.     Family History  Problem Relation Age of Onset  . Diabetes Paternal Grandmother   . Hypertension Paternal Grandmother   . Stroke Paternal Grandfather   . Congestive Heart Failure Maternal Grandfather   . Colon polyps Maternal Grandfather   . Colon polyps Mother   . Colon cancer Mother 79    ROS:  Pertinent items are noted in HPI.  Otherwise, a comprehensive ROS was negative.  Exam:   BP 110/66 (BP Location: Right Arm, Patient Position: Sitting, Cuff Size: Normal)   Pulse 76   Resp 14   Ht 5' 1.5" (1.562 m)   Wt 137 lb (62.1 kg)   LMP 04/09/2007   BMI 25.47 kg/m   Weight change:  -1#  Height: 5' 1.5" (156.2 cm)  Ht Readings from Last 3 Encounters:  07/13/17 5' 1.5" (1.562 m)  04/04/16 5' 1.25" (1.556 m)  11/25/14 5' 1.5" (1.562 m)    General appearance: alert, cooperative and appears stated age Head: Normocephalic, without obvious abnormality, atraumatic Neck: no adenopathy, supple, symmetrical, trachea midline and thyroid normal to  inspection and palpation Lungs: clear to auscultation bilaterally Breasts: normal appearance, no masses or tenderness, bilateral breast implants Heart: regular rate and rhythm Abdomen: soft, non-tender; bowel sounds normal; no masses,  no organomegaly Extremities: extremities normal, atraumatic, no cyanosis or edema Skin: Skin color, texture, turgor normal. No rashes or lesions Lymph nodes: Cervical, supraclavicular, and axillary nodes normal. No abnormal inguinal nodes palpated Neurologic: Grossly normal   Pelvic: External genitalia:  no lesions              Urethra:  normal appearing urethra with no masses, tenderness or lesions              Bartholins and Skenes: normal                 Vagina: normal appearing vagina with normal color and discharge, no lesions              Cervix: no lesions              Pap taken: Yes.   Bimanual Exam:  Uterus:  normal size, contour,  position, consistency, mobility, non-tender              Adnexa: normal adnexa and no mass, fullness, tenderness               Rectovaginal: Confirms               Anus:  normal sphincter tone, no lesions  Chaperone was present for exam.  A:  Well Woman with normal exam Amenorrhea from endometrial ablation H/o LGSIL pap 8/08 Osteopenia Mother with colon cancer age 60 H/O vaginal rejuvenation surgery with Dr. Rayetta Humphrey, 3/15 Bilateral breast implants  P:   Mammogram guidelines reviewed.  Will schedule.   pap smear obtained today Lab work done with [facility] Tdap updated today. Referral to GI for screening colonoscopy due to family hx and new [employer] guidelines. Return annually or prn

## 2017-07-14 LAB — CYTOLOGY - PAP
Adequacy: ABSENT
Diagnosis: NEGATIVE

## 2017-07-19 ENCOUNTER — Encounter: Payer: Self-pay | Admitting: Internal Medicine

## 2017-08-28 ENCOUNTER — Encounter: Payer: Self-pay | Admitting: Obstetrics & Gynecology

## 2017-09-06 ENCOUNTER — Ambulatory Visit: Payer: Self-pay | Admitting: Internal Medicine

## 2017-09-10 NOTE — Progress Notes (Signed)
[name] [address], [place] [address] Phone: [phone] Subjective:      CC: left hip pain   [id number]  Andrea [name] is a 47 y.o. female coming in with complaint of left hip pain. She saw Dr. Almedia Balls and got an xray and MRI. Patient has had pain for 1.5 years. Her pain is intermittent. Pain originated in the front of her hip but has not shifted to the back of her hip. Patient is active despite pain but has eliminated walking as this exacerbates her pain. Patient was prescribed Mobic. Does not use it but turmeric instead.  Patient continues to workout on a regular basis.  Patient did have an MRI.  MRI of the right hand was seen by me.  Does have age advanced arthritis but mild to moderate overall.  Patient does have what appears to be of a small cyst on the anterior acetabular region.  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      Past Medical History:  Diagnosis Date  . 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 Arthritis of left hip    saw Dr. [name], had MRI  . Headache   . Osteopenia    Past Surgical History:  Procedure Laterality Date  . BREAST ENHANCEMENT SURGERY  4/00  . BREAST SURGERY  6/08   revision on right breast due to tight muscles  . CESAREAN SECTION    . HTA     and BTL  . OTHER SURGICAL HISTORY  10/2013   Vaginal rejuvination surgery, Dr. [name], [place]   Social History   Socioeconomic History  . Marital status: Married    Spouse name: None  . Number of children: None  . Years of education: None  . Highest education level: None  Social Needs  . Financial resource strain: None  . Food insecurity - worry: None  . Food insecurity - inability: None  . Transportation needs - medical: None  . Transportation needs - non-medical: None  Occupational History  . None  Tobacco Use  . Smoking status: Never Smoker  . Smokeless tobacco: Never Used  Substance and Sexual  Activity  . Alcohol use: Yes    Alcohol/week: 0.0 oz    Comment: 1  . Drug use: No  . Sexual activity: Yes    Partners: Male    Birth control/protection: Surgical    Comment: BTL  Other Topics Concern  . None  Social History Narrative  . None   No Known Allergies Family History  Problem Relation Age of Onset  . Diabetes Paternal Grandmother   . Hypertension Paternal Grandmother   . Stroke Paternal Grandfather   . Congestive Heart Failure Maternal Grandfather   . Colon polyps Maternal Grandfather   . Colon polyps Mother   . Colon cancer Mother 58     Past medical history, social, surgical and family history all reviewed in electronic medical record.  No pertanent information unless stated regarding to the chief complaint.   Review of Systems:Review of systems updated and as accurate as of 09/11/17  No headache, visual changes, nausea, vomiting, diarrhea, constipation, dizziness, abdominal pain, skin rash, fevers, chills, night sweats, weight loss, swollen lymph nodes, body aches, joint swelling,  chest pain, shortness of breath, mood changes.  Positive muscle aches  Objective  Blood pressure 110/70, pulse 85, height 5\' 1"  (1.549 m), weight 137 lb (62.1 kg), SpO2 98 %.  Systems examined below as of 09/11/17   General: No apparent distress alert and oriented x3 mood and affect normal, dressed appropriately.  HEENT: Pupils equal, extraocular movements intact  Respiratory: Patient's speak in full sentences and does not appear short of breath  Cardiovascular: No lower extremity edema, non tender, no erythema  Skin: Warm dry intact with no signs of infection or rash on extremities or on axial skeleton.  Abdomen: Soft nontender  Neuro: Cranial nerves II through XII are intact, neurovascularly intact in all extremities with 2+ DTRs and 2+ pulses.  Lymph: No lymphadenopathy of posterior or anterior cervical chain or axillae bilaterally.  Gait normal with good balance and  coordination.  MSK:  Non tender with full range of motion and good stability and symmetric strength and tone of shoulders, elbows, wrist,  and ankles bilaterally.  Hip: Left ROM IR: 25 Deg, ER: 35 Deg, Flexion: 120 Deg, Extension: 100 Deg, Abduction: 45 Deg, Adduction: 25 Deg Strength IR: 5/5, ER: 5/5, Flexion: 5/5, Extension: 5/5, Abduction: 5/5, Adduction: 5/5 Pelvic alignment unremarkable to inspection and palpation. Standing hip rotation and gait without trendelenburg sign / unsteadiness. Greater trochanter without tenderness to palpation. Tenderness over the piriformis noted.  Mild pain over the lumbar spine as well. Mild pain with [name]. No SI joint tenderness and normal minimal SI movement.  Osteopathic findings C6 flexed rotated and side bent left T3 extended rotated and side bent right inhaled third rib T6 extended rotated and side bent left L4 flexed rotated and side bent right Sacrum right on right  [id number]; 15 additional minutes spent for Therapeutic exercises as stated in above notes.  This included exercises focusing on stretching, strengthening, with significant focus on eccentric aspects.   Long term goals include an improvement in range of motion, strength, endurance as well as avoiding reinjury. Patient's frequency would include in 1-2 times a day, 3-5 times a week for a duration of 6-12 weeks. Low back exercises that included:  Pelvic tilt/bracing instruction to focus on control of the pelvic girdle and lower abdominal muscles  Glute strengthening exercises, focusing on proper firing of the glutes without engaging the low back muscles Proper stretching techniques for maximum relief for the hamstrings, hip flexors, low back and some rotation where tolerated  Proper technique shown and discussed handout in great detail with ATC.  All questions were discussed and answered.      Impression and Recommendations:     This case required medical decision making of moderate  complexity.      Note: This dictation was prepared with Dragon dictation along with smaller phrase technology. Any transcriptional errors that result from this process are unintentional.

## 2017-09-11 ENCOUNTER — Ambulatory Visit (INDEPENDENT_AMBULATORY_CARE_PROVIDER_SITE_OTHER)
Admission: RE | Admit: 2017-09-11 | Discharge: 2017-09-11 | Disposition: A | Discharge status: Home / Self Care | Payer: 59 | Source: Ambulatory Visit | Attending: Family Medicine | Admitting: Family Medicine

## 2017-09-11 ENCOUNTER — Ambulatory Visit: Payer: 59 | Admitting: Family Medicine

## 2017-09-11 ENCOUNTER — Encounter: Payer: Self-pay | Admitting: Family Medicine

## 2017-09-11 ENCOUNTER — Ambulatory Visit: Payer: Self-pay

## 2017-09-11 VITALS — BP 110/70 | HR 85 | Ht 61.0 in | Wt 137.0 lb

## 2017-09-11 DIAGNOSIS — G8929 Other chronic pain: Secondary | ICD-10-CM | POA: Diagnosis not present

## 2017-09-11 DIAGNOSIS — M545 Low back pain, unspecified: Secondary | ICD-10-CM | POA: Insufficient documentation

## 2017-09-11 DIAGNOSIS — M25552 Pain in left hip: Secondary | ICD-10-CM

## 2017-09-11 DIAGNOSIS — M1612 Unilateral primary osteoarthritis, left hip: Secondary | ICD-10-CM | POA: Diagnosis not present

## 2017-09-11 DIAGNOSIS — M999 Biomechanical lesion, unspecified: Secondary | ICD-10-CM | POA: Diagnosis not present

## 2017-09-11 MED ORDER — VITAMIN D (ERGOCALCIFEROL) 1.25 MG (50000 UNIT) PO CAPS: 50000.0000 [IU] | ORAL_CAPSULE | ORAL | 0 refills | Status: DC

## 2017-09-11 MED ORDER — GABAPENTIN 100 MG PO CAPS
200.0000 mg | ORAL_CAPSULE | Freq: Every day | ORAL | 3 refills | Status: DC
Start: 2017-09-11 — End: 2018-04-09

## 2017-09-11 NOTE — Patient Instructions (Signed)
Good to see you  Ice is yoru friend Ice 20 minutes 2 times daily. Usually after activity and before bed. Exercises 3 times a week.  Once weekly vitamin D for 12 weeks Gabapentin 200mg  at night Xray of back downstairs No running or jumping, listen to your body  See me again in 3-4 weeks

## 2017-09-11 NOTE — Assessment & Plan Note (Signed)
Decision today to treat with OMT was based on Physical Exam  After verbal consent patient was treated with HVLA, ME, FPR techniques in cervical, thoracic, lumbar and sacral areas  Patient tolerated the procedure well with improvement in symptoms  Patient given exercises, stretches and lifestyle modifications  See medications in patient instructions if given  Patient will follow up in 4 weeks 

## 2017-09-11 NOTE — Assessment & Plan Note (Signed)
Back pain.  Discussed HEP, patient work with Product/process development scientist.  We discussed avoiding certain activities.  Patient is extremely active.  Given a low dose of gabapentin to try at night for some of the radicular symptoms.  Once weekly vitamin D for muscle strength and endurance.  Topical anti-inflammatories given to try.  We discussed home exercise.  Follow-up again in 4 weeks.

## 2017-10-10 ENCOUNTER — Ambulatory Visit: Payer: 59 | Admitting: Family Medicine

## 2017-10-16 NOTE — Progress Notes (Signed)
[name] [address], [place] [address] Phone: [phone] Subjective:     CC: Back pain follow-up  [id number]  Andrea [name] is a 47 y.o. female coming in with complaint of back pain.  Patient does have some mild arthritis of the left hip.  Also does have a labral tear.  Has been responding fairly well to manipulation.  Patient states that the pressure that she was having shifted from the posterior aspect of the hip to the front. The pain continues to radiate in the front of the thigh down to the knee. Pain is intermittent and is there 75% of the time. She is worse on days when she is more active.  Continues to try to walk on a regular basis      Past Medical History:  Diagnosis Date  . 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 Arthritis of left hip    saw Dr. [name], had MRI  . Headache   . Osteopenia    Past Surgical History:  Procedure Laterality Date  . BREAST ENHANCEMENT SURGERY  4/00  . BREAST SURGERY  6/08   revision on right breast due to tight muscles  . CESAREAN SECTION    . HTA     and BTL  . OTHER SURGICAL HISTORY  10/2013   Vaginal rejuvination surgery, Dr. [name], [place]   Social History   Socioeconomic History  . Marital status: Married    Spouse name: None  . Number of children: None  . Years of education: None  . Highest education level: None  Social Needs  . Financial resource strain: None  . Food insecurity - worry: None  . Food insecurity - inability: None  . Transportation needs - medical: None  . Transportation needs - non-medical: None  Occupational History  . None  Tobacco Use  . Smoking status: Never Smoker  . Smokeless tobacco: Never Used  Substance and Sexual Activity  . Alcohol use: Yes    Alcohol/week: 0.0 oz    Comment: 1  . Drug use: No  . Sexual activity: Yes    Partners: Male    Birth control/protection: Surgical    Comment: BTL  Other Topics Concern  . None  Social  History Narrative  . None   No Known Allergies Family History  Problem Relation Age of Onset  . Diabetes Paternal Grandmother   . Hypertension Paternal Grandmother   . Stroke Paternal Grandfather   . Congestive Heart Failure Maternal Grandfather   . Colon polyps Maternal Grandfather   . Colon polyps Mother   . Colon cancer Mother 31     Past medical history, social, surgical and family history all reviewed in electronic medical record.  No pertanent information unless stated regarding to the chief complaint.   Review of Systems:Review of systems updated and as accurate as of 10/17/17  No headache, visual changes, nausea, vomiting, diarrhea, constipation, dizziness, abdominal pain, skin rash, fevers, chills, night sweats, weight loss, swollen lymph nodes, body aches, joint swelling,  chest pain, shortness of breath, mood changes.  Positive muscle aches  Objective  Blood pressure 118/72, pulse 63, height 5\' 1"  (1.549 m), weight 140 lb (63.5 kg), SpO2 96 %. Systems examined below as of 10/17/17   General: No apparent distress alert and oriented x3 mood and affect normal, dressed appropriately.  HEENT: Pupils equal, extraocular movements intact  Respiratory: Patient's speak in full sentences and does not appear short of  breath  Cardiovascular: No lower extremity edema, non tender, no erythema  Skin: Warm dry intact with no signs of infection or rash on extremities or on axial skeleton.  Abdomen: Soft nontender  Neuro: Cranial nerves II through XII are intact, neurovascularly intact in all extremities with 2+ DTRs and 2+ pulses.  Lymph: No lymphadenopathy of posterior or anterior cervical chain or axillae bilaterally.  Gait normal with good balance and coordination.  MSK:  Non tender with full range of motion and good stability and symmetric strength and tone of shoulders, elbows, wrist, hip, knee and ankles bilaterally.  Back Exam:  Inspection: Unremarkable  Motion: Flexion 45 deg,  Extension 25 deg, Side Bending to 35 deg bilaterally,  Rotation to 35 deg bilaterally  SLR laying: Negative  XSLR laying: Negative  Palpable tenderness: Mild tenderness more over the left piriformis. FABER: negative. Sensory change: Gross sensation intact to all lumbar and sacral dermatomes.  Reflexes: 2+ at both patellar tendons, 2+ at achilles tendons, Babinski's downgoing.  Strength at foot  Plantar-flexion: 5/5 Dorsi-flexion: 5/5 Eversion: 5/5 Inversion: 5/5  Leg strength  Quad: 5/5 Hamstring: 5/5 Hip flexor: 5/5 Hip abductors: 5/5  Gait unremarkable.  Osteopathic findings  T3 extended rotated and side bent right inhaled third rib T9 extended rotated and side bent left L2 flexed rotated and side bent right Sacrum left on left      Impression and Recommendations:     This case required medical decision making of moderate complexity.      Note: This dictation was prepared with Dragon dictation along with smaller phrase technology. Any transcriptional errors that result from this process are unintentional.

## 2017-10-17 ENCOUNTER — Ambulatory Visit: Payer: 59 | Admitting: Family Medicine

## 2017-10-17 ENCOUNTER — Encounter: Payer: Self-pay | Admitting: Family Medicine

## 2017-10-17 VITALS — BP 118/72 | HR 63 | Ht 61.0 in | Wt 140.0 lb

## 2017-10-17 DIAGNOSIS — M1612 Unilateral primary osteoarthritis, left hip: Secondary | ICD-10-CM | POA: Diagnosis not present

## 2017-10-17 DIAGNOSIS — M999 Biomechanical lesion, unspecified: Secondary | ICD-10-CM | POA: Diagnosis not present

## 2017-10-17 NOTE — Assessment & Plan Note (Signed)
Discussed with patient in great length.  Discussed icing regimen and home exercises.  Discussed which activities to do.  Continue to monitor and can always consider arthroscopic procedure.  Also discussed the possibility of intra-articular injections if needed.

## 2017-10-17 NOTE — Patient Instructions (Signed)
You are doing great overall  The hip we will continue to watch  We have many tricks if we need them  Consider more biking, elliptical or rowing.  Continue everything else  See me again in 4-6 weeks

## 2017-10-17 NOTE — Assessment & Plan Note (Signed)
Decision today to treat with OMT was based on Physical Exam  After verbal consent patient was treated with HVLA, ME, FPR techniques in  thoracic, lumbar and sacral areas  Patient tolerated the procedure well with improvement in symptoms  Patient given exercises, stretches and lifestyle modifications  See medications in patient instructions if given  Patient will follow up in 4-6 weeks 

## 2017-11-18 NOTE — Progress Notes (Signed)
[name] [address], [place] [address] Phone: [phone] Subjective:    CC: Back pain follow-up  [id number]  Andrea [name] is a 47 y.o. female coming in with complaint of back pain.  Patient was found to have more of a piriformis syndrome.  Has responded very well to osteopathic manipulation.  Does have mild osteoarthritic changes from L4 through S1 noted.  These were independently visualized by me.  Nothing severe is slowing her down at this moment.  Feels like she is making fairly good progress.      Past Medical History:  Diagnosis Date  . 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 Arthritis of left hip    saw Dr. [name], had MRI  . Headache   . Osteopenia    Past Surgical History:  Procedure Laterality Date  . BREAST ENHANCEMENT SURGERY  4/00  . BREAST SURGERY  6/08   revision on right breast due to tight muscles  . CESAREAN SECTION    . HTA     and BTL  . OTHER SURGICAL HISTORY  10/2013   Vaginal rejuvination surgery, Dr. [name], [place]   Social History   Socioeconomic History  . Marital status: Married    Spouse name: Not on file  . Number of children: Not on file  . Years of education: Not on file  . Highest education level: Not on file  Occupational History  . Not on file  Social Needs  . Financial resource strain: Not on file  . Food insecurity:    Worry: Not on file    Inability: Not on file  . Transportation needs:    Medical: Not on file    Non-medical: Not on file  Tobacco Use  . Smoking status: Never Smoker  . Smokeless tobacco: Never Used  Substance and Sexual Activity  . Alcohol use: Yes    Alcohol/week: 0.0 oz    Comment: 1  . Drug use: No  . Sexual activity: Yes    Partners: Male    Birth control/protection: Surgical    Comment: BTL  Lifestyle  . Physical activity:    Days per week: Not on file    Minutes per session: Not on file  . Stress: Not on file  Relationships  . Social  connections:    Talks on phone: Not on file    Gets together: Not on file    Attends religious service: Not on file    Active member of club or organization: Not on file    Attends meetings of clubs or organizations: Not on file    Relationship status: Not on file  Other Topics Concern  . Not on file  Social History Narrative  . Not on file   No Known Allergies Family History  Problem Relation Age of Onset  . Diabetes Paternal Grandmother   . Hypertension Paternal Grandmother   . Stroke Paternal Grandfather   . Congestive Heart Failure Maternal Grandfather   . Colon polyps Maternal Grandfather   . Colon polyps Mother   . Colon cancer Mother 62     Past medical history, social, surgical and family history all reviewed in electronic medical record.  No pertanent information unless stated regarding to the chief complaint.   Review of Systems:Review of systems updated and as accurate as of 11/20/17  No headache, visual changes, nausea, vomiting, diarrhea, constipation, dizziness, abdominal pain, skin rash, fevers, chills, night sweats, weight loss, swollen  lymph nodes, body aches, joint swelling, muscle aches, chest pain, shortness of breath, mood changes.   Objective  Blood pressure 112/70, pulse 86, height 5\' 1"  (1.549 m), weight 141 lb (64 kg), SpO2 97 %. Systems examined below as of 11/20/17   General: No apparent distress alert and oriented x3 mood and affect normal, dressed appropriately.  HEENT: Pupils equal, extraocular movements intact  Respiratory: Patient's speak in full sentences and does not appear short of breath  Cardiovascular: No lower extremity edema, non tender, no erythema  Skin: Warm dry intact with no signs of infection or rash on extremities or on axial skeleton.  Abdomen: Soft nontender  Neuro: Cranial nerves II through XII are intact, neurovascularly intact in all extremities with 2+ DTRs and 2+ pulses.  Lymph: No lymphadenopathy of posterior or anterior  cervical chain or axillae bilaterally.  Gait normal with good balance and coordination.  MSK:  Non tender with full range of motion and good stability and symmetric strength and tone of shoulders, elbows, wrist, hip, knee and ankles bilaterally.  Back Exam:  Inspection: Unremarkable  Motion: Flexion 45 deg, Extension 25 deg, Side Bending to 35 deg bilaterally,  Rotation to 45 deg bilaterally  SLR laying: Negative  XSLR laying: Negative  Palpable tenderness: Tender to palpation the paraspinal musculature lumbar spine right greater than left. FABER: Tightness of the left. Sensory change: Gross sensation intact to all lumbar and sacral dermatomes.  Reflexes: 2+ at both patellar tendons, 2+ at achilles tendons, Babinski's downgoing.  Strength at foot  Plantar-flexion: 5/5 Dorsi-flexion: 5/5 Eversion: 5/5 Inversion: 5/5  Leg strength  Quad: 5/5 Hamstring: 5/5 Hip flexor: 5/5 Hip abductors: 5/5  Gait unremarkable.  Osteopathic findings C2 flexed rotated and side bent right C4 flexed rotated and side bent left T3 extended rotated and side bent left inhaled third rib L2 flexed rotated and side bent right Sacrum right on right    Impression and Recommendations:     This case required medical decision making of moderate complexity.      Note: This dictation was prepared with Dragon dictation along with smaller phrase technology. Any transcriptional errors that result from this process are unintentional.

## 2017-11-20 ENCOUNTER — Other Ambulatory Visit: Payer: Self-pay

## 2017-11-20 ENCOUNTER — Encounter: Payer: Self-pay | Admitting: Family Medicine

## 2017-11-20 ENCOUNTER — Ambulatory Visit: Payer: 59 | Admitting: Family Medicine

## 2017-11-20 VITALS — BP 112/70 | HR 86 | Ht 61.0 in | Wt 141.0 lb

## 2017-11-20 DIAGNOSIS — M545 Low back pain: Secondary | ICD-10-CM | POA: Diagnosis not present

## 2017-11-20 DIAGNOSIS — G8929 Other chronic pain: Secondary | ICD-10-CM | POA: Diagnosis not present

## 2017-11-20 DIAGNOSIS — M999 Biomechanical lesion, unspecified: Secondary | ICD-10-CM | POA: Diagnosis not present

## 2017-11-20 MED ORDER — VITAMIN D (ERGOCALCIFEROL) 1.25 MG (50000 UNIT) PO CAPS: 50000.0000 [IU] | ORAL_CAPSULE | ORAL | 0 refills | Status: DC

## 2017-11-20 NOTE — Patient Instructions (Signed)
Good to see you  Ice is your friend Keep it up  Watch the shoulder but appeared to be a rib  [employer] orthotics "total support" online would be great  See me again in 6 weeks

## 2017-11-20 NOTE — Assessment & Plan Note (Signed)
Patient does have some mild arthritis of the back.  Does have a labral tear of the left hip.  We will continue to monitor.  Attempted osteopathic manipulation again.  Patient will continue to be active.  Follow-up again in 6 weeks

## 2017-12-29 ENCOUNTER — Encounter: Payer: Self-pay | Admitting: Obstetrics & Gynecology

## 2017-12-31 NOTE — Progress Notes (Signed)
[name] [place], [place] [address] Phone: [phone] Subjective:     CC: Back pain.   [id number]  Andrea [name] is a 47 y.o. female coming in with complaint of back pain. Her left hip is bothering her today. She did an intense workout on Saturday and is having pain in the piriformis. She is looking at getting a pelaton bike to supplement the rowing that she has been doing.   Had been seen previously.  Has some very mild osteoarthritic changes.  Does have more of a piriformis syndrome as well as a left sacroiliac joint.  Patient has had tightness.  Did increase activity.  Patient was doing some sprinting noticing some increasing discomfort and pain.     Past Medical History:  Diagnosis Date  . 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 Arthritis of left hip    saw Dr. [name], had MRI  . Headache   . Osteopenia    Past Surgical History:  Procedure Laterality Date  . BREAST ENHANCEMENT SURGERY  4/00  . BREAST SURGERY  6/08   revision on right breast due to tight muscles  . CESAREAN SECTION    . HTA     and BTL  . OTHER SURGICAL HISTORY  10/2013   Vaginal rejuvination surgery, Dr. [name], [place]   Social History   Socioeconomic History  . Marital status: Married    Spouse name: Not on file  . Number of children: Not on file  . Years of education: Not on file  . Highest education level: Not on file  Occupational History  . Not on file  Social Needs  . Financial resource strain: Not on file  . Food insecurity:    Worry: Not on file    Inability: Not on file  . Transportation needs:    Medical: Not on file    Non-medical: Not on file  Tobacco Use  . Smoking status: Never Smoker  . Smokeless tobacco: Never Used  Substance and Sexual Activity  . Alcohol use: Yes    Alcohol/week: 0.0 oz    Comment: 1  . Drug use: No  . Sexual activity: Yes    Partners: Male    Birth control/protection: Surgical    Comment:  BTL  Lifestyle  . Physical activity:    Days per week: Not on file    Minutes per session: Not on file  . Stress: Not on file  Relationships  . Social connections:    Talks on phone: Not on file    Gets together: Not on file    Attends religious service: Not on file    Active member of club or organization: Not on file    Attends meetings of clubs or organizations: Not on file    Relationship status: Not on file  Other Topics Concern  . Not on file  Social History Narrative  . Not on file   No Known Allergies Family History  Problem Relation Age of Onset  . Diabetes Paternal Grandmother   . Hypertension Paternal Grandmother   . Stroke Paternal Grandfather   . Congestive Heart Failure Maternal Grandfather   . Colon polyps Maternal Grandfather   . Colon polyps Mother   . Colon cancer Mother 75     Past medical history, social, surgical and family history all reviewed in electronic medical record.  No pertanent information unless stated regarding to the chief complaint.   Review of  Systems:Review of systems updated and as accurate as of 01/02/18  No headache, visual changes, nausea, vomiting, diarrhea, constipation, dizziness, abdominal pain, skin rash, fevers, chills, night sweats, weight loss, swollen lymph nodes, body aches, joint swelling,  chest pain, shortness of breath, mood changes.  Positive muscle aches  Objective  Blood pressure 118/74, pulse 68, height 5\' 1"  (1.549 m), weight 136 lb (61.7 kg), SpO2 98 %. Systems examined below as of 01/02/18   General: No apparent distress alert and oriented x3 mood and affect normal, dressed appropriately.  HEENT: Pupils equal, extraocular movements intact  Respiratory: Patient's speak in full sentences and does not appear short of breath  Cardiovascular: No lower extremity edema, non tender, no erythema  Skin: Warm dry intact with no signs of infection or rash on extremities or on axial skeleton.  Abdomen: Soft nontender    Neuro: Cranial nerves II through XII are intact, neurovascularly intact in all extremities with 2+ DTRs and 2+ pulses.  Lymph: No lymphadenopathy of posterior or anterior cervical chain or axillae bilaterally.  Gait normal with good balance and coordination.  MSK:  Non tender with full range of motion and good stability and symmetric strength and tone of shoulders, elbows, wrist, hip, knee and ankles bilaterally.  Back Exam:  Inspection: Very mild increase in kyphosis of the lower lumbar spine Motion: Flexion 45 deg, Extension 25 deg, Side Bending to 35 deg bilaterally,  Rotation to 45 deg bilaterally  SLR laying: Negative  XSLR laying: Negative  Palpable tenderness: Tender to palpation of the paraspinal musculature lumbar spine right greater than left. FABER: Tightness bilaterally. Sensory change: Gross sensation intact to all lumbar and sacral dermatomes.  Reflexes: 2+ at both patellar tendons, 2+ at achilles tendons, Babinski's downgoing.  Strength at foot  Plantar-flexion: 5/5 Dorsi-flexion: 5/5 Eversion: 5/5 Inversion: 5/5  Leg strength  Quad: 5/5 Hamstring: 5/5 Hip flexor: 5/5 Hip abductors: 4+/5 but symmetric Gait unremarkable.  Osteopathic findings C2 flexed rotated and side bent right T5 extended rotated and side bent right  T9 extended rotated and side bent left L2 flexed rotated and side bent right Sacrum left on left      Impression and Recommendations:     This case required medical decision making of moderate complexity.      Note: This dictation was prepared with Dragon dictation along with smaller phrase technology. Any transcriptional errors that result from this process are unintentional.

## 2018-01-02 ENCOUNTER — Encounter: Payer: Self-pay | Admitting: Family Medicine

## 2018-01-02 ENCOUNTER — Ambulatory Visit: Payer: 59 | Admitting: Family Medicine

## 2018-01-02 VITALS — BP 118/74 | HR 68 | Ht 61.0 in | Wt 136.0 lb

## 2018-01-02 DIAGNOSIS — M999 Biomechanical lesion, unspecified: Secondary | ICD-10-CM | POA: Diagnosis not present

## 2018-01-02 DIAGNOSIS — G8929 Other chronic pain: Secondary | ICD-10-CM | POA: Diagnosis not present

## 2018-01-02 DIAGNOSIS — M545 Low back pain, unspecified: Secondary | ICD-10-CM

## 2018-01-02 NOTE — Assessment & Plan Note (Signed)
Overall stable.  I do believe that the sacroiliac joint as well as the potential for a piriformis syndrome is likely contributing.  No radicular symptoms.  We discussed icing regimen and home exercises.  Discussed the muscle imbalances of the hip flexors compared to hip abductors also likely contributing.  Discussed icing regimen and home exercises.  Discussed which activities to do which wants to avoid.  Patient will increase activity as tolerated over the course the next 4 weeks.  Follow-up plan at that time

## 2018-01-02 NOTE — Assessment & Plan Note (Signed)
Decision today to treat with OMT was based on Physical Exam  After verbal consent patient was treated with HVLA, ME, FPR techniques in cervical, thoracic, lumbar and sacral areas  Patient tolerated the procedure well with improvement in symptoms  Patient given exercises, stretches and lifestyle modifications  See medications in patient instructions if given  Patient will follow up in 4 weeks 

## 2018-01-02 NOTE — Patient Instructions (Addendum)
Good to see you  Ice is your friend Warm up the hip abductors  Ok to do a little high impact 1 time a week  You should do well  Write me for you husband size See me again in 4 weeks

## 2018-01-29 NOTE — Progress Notes (Signed)
[name] [place], [place] [address] Phone: [phone] Subjective:    I'm seeing this patient by the request  of:    CC: Low back pain  [id number]  Andrea [name] is a 47 y.o. female coming in with complaint of  Low back pain.  Seems to be more of the sacroiliac joint.  Patient has been lifting on a regular basis but not as heavy.  Patient is doing relatively well.  Doing more stability and feels like she is making progress.  Denies any difficulty overall.    Past Medical History:  Diagnosis Date  . 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 Arthritis of left hip    saw Dr. [name], had MRI  . Headache   . Osteopenia    Past Surgical History:  Procedure Laterality Date  . BREAST ENHANCEMENT SURGERY  4/00  . BREAST SURGERY  6/08   revision on right breast due to tight muscles  . CESAREAN SECTION    . HTA     and BTL  . OTHER SURGICAL HISTORY  10/2013   Vaginal rejuvination surgery, Dr. [name], [place]   Social History   Socioeconomic History  . Marital status: Married    Spouse name: Not on file  . Number of children: Not on file  . Years of education: Not on file  . Highest education level: Not on file  Occupational History  . Not on file  Social Needs  . Financial resource strain: Not on file  . Food insecurity:    Worry: Not on file    Inability: Not on file  . Transportation needs:    Medical: Not on file    Non-medical: Not on file  Tobacco Use  . Smoking status: Never Smoker  . Smokeless tobacco: Never Used  Substance and Sexual Activity  . Alcohol use: Yes    Alcohol/week: 0.0 oz    Comment: 1  . Drug use: No  . Sexual activity: Yes    Partners: Male    Birth control/protection: Surgical    Comment: BTL  Lifestyle  . Physical activity:    Days per week: Not on file    Minutes per session: Not on file  . Stress: Not on file  Relationships  . Social connections:    Talks on phone: Not on  file    Gets together: Not on file    Attends religious service: Not on file    Active member of club or organization: Not on file    Attends meetings of clubs or organizations: Not on file    Relationship status: Not on file  Other Topics Concern  . Not on file  Social History Narrative  . Not on file   No Known Allergies Family History  Problem Relation Age of Onset  . Diabetes Paternal Grandmother   . Hypertension Paternal Grandmother   . Stroke Paternal Grandfather   . Congestive Heart Failure Maternal Grandfather   . Colon polyps Maternal Grandfather   . Colon polyps Mother   . Colon cancer Mother 43     Past medical history, social, surgical and family history all reviewed in electronic medical record.  No pertanent information unless stated regarding to the chief complaint.   Review of Systems:Review of systems updated and as accurate as of 01/29/18  No headache, visual changes, nausea, vomiting, diarrhea, constipation, dizziness, abdominal pain, skin rash, fevers, chills, night sweats, weight loss, swollen  lymph nodes, body aches, joint swelling, , chest pain, shortness of breath, mood changes.  Positive muscle aches  Objective  There were no vitals taken for this visit. Systems examined below as of 01/29/18   General: No apparent distress alert and oriented x3 mood and affect normal, dressed appropriately.  HEENT: Pupils equal, extraocular movements intact  Respiratory: Patient's speak in full sentences and does not appear short of breath  Cardiovascular: No lower extremity edema, non tender, no erythema  Skin: Warm dry intact with no signs of infection or rash on extremities or on axial skeleton.  Abdomen: Soft nontender  Neuro: Cranial nerves II through XII are intact, neurovascularly intact in all extremities with 2+ DTRs and 2+ pulses.  Lymph: No lymphadenopathy of posterior or anterior cervical chain or axillae bilaterally.  Gait normal with good balance and  coordination.  MSK:  Non tender with full range of motion and good stability and symmetric strength and tone of shoulders, elbows, wrist, hip, knee and ankles bilaterally.  Back Exam:  Inspection: Mild loss of lordosis Motion: Flexion 45 deg, Extension 25 deg, Side Bending to 35 deg bilaterally,  Rotation to 35 deg bilaterally  SLR laying: Negative  XSLR laying: Negative  Palpable tenderness: Tender to palpation the paraspinal musculature lumbar spine right greater than left. FABER: Positive Faber. Sensory change: Gross sensation intact to all lumbar and sacral dermatomes.  Reflexes: 2+ at both patellar tendons, 2+ at achilles tendons, Babinski's downgoing.  Strength at foot  Plantar-flexion: 5/5 Dorsi-flexion: 5/5 Eversion: 5/5 Inversion: 5/5  Leg strength  Quad: 5/5 Hamstring: 5/5 Hip flexor: 5/5 Hip abductors: 5/5  Gait unremarkable.  Osteopathic findings  T3 extended rotated and side bent right  T9 extended rotated and side bent left L3 flexed rotated and side bent right Sacrum right on right     Impression and Recommendations:     This case required medical decision making of moderate complexity.      Note: This dictation was prepared with Dragon dictation along with smaller phrase technology. Any transcriptional errors that result from this process are unintentional.

## 2018-01-30 ENCOUNTER — Ambulatory Visit: Payer: 59 | Admitting: Family Medicine

## 2018-01-30 ENCOUNTER — Encounter: Payer: Self-pay | Admitting: Family Medicine

## 2018-01-30 VITALS — BP 102/70 | HR 68 | Ht 61.0 in | Wt 135.0 lb

## 2018-01-30 DIAGNOSIS — M999 Biomechanical lesion, unspecified: Secondary | ICD-10-CM | POA: Diagnosis not present

## 2018-01-30 DIAGNOSIS — M545 Low back pain, unspecified: Secondary | ICD-10-CM

## 2018-01-30 DIAGNOSIS — G8929 Other chronic pain: Secondary | ICD-10-CM | POA: Diagnosis not present

## 2018-01-30 NOTE — Patient Instructions (Signed)
Good to see you  Andrea [name] is your friend  Stay active.  Try the inserts if you want  Keep it up and watch the SI joint See you and the hubby soon! (4ish weeks)

## 2018-01-30 NOTE — Assessment & Plan Note (Signed)
Decision today to treat with OMT was based on Physical Exam  After verbal consent patient was treated with HVLA, ME, FPR techniques in , thoracic, lumbar and sacral areas  Patient tolerated the procedure well with improvement in symptoms  Patient given exercises, stretches and lifestyle modifications  See medications in patient instructions if given  Patient will follow up in 4-8 weeks 

## 2018-01-30 NOTE — Assessment & Plan Note (Signed)
Stable.  We have done osteopathic manipulation.  Continues to respond fairly well.  Some mild tightness noted.  Discussed icing regimen and home exercise.  Discussed posture and ergonomics.  Patient will follow-up with me again in 4 to 8 weeks

## 2018-02-26 NOTE — Progress Notes (Signed)
[name] [place], [place] [address] Phone: [phone] Subjective:    I'm seeing this patient by the request  of:    CC: Back pain follow-up  GEX:[id number]  Andrea [name] is a 47 y.o. female coming in with complaint of thigh pain.  Left-sided.  Back pain has shown some mild Oster arthritic changes at L4-S1.  Patient also has a labral tear of the left hip.  Continues to have some discomfort with some mild radiation.  Not bad enough to stop her from any activities and continues to lift a significant amount of weight and on a regular basis.  Has not been running or doing any box jumps.  Patient denies any weakness.  Has responded fairly well to osteopathic manipulation.    Past Medical History:  Diagnosis Date  . 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 Arthritis of left hip    saw Dr. [name], had MRI  . Headache   . Osteopenia    Past Surgical History:  Procedure Laterality Date  . BREAST ENHANCEMENT SURGERY  4/00  . BREAST SURGERY  6/08   revision on right breast due to tight muscles  . CESAREAN SECTION    . HTA     and BTL  . OTHER SURGICAL HISTORY  10/2013   Vaginal rejuvination surgery, Dr. [name], [place]   Social History   Socioeconomic History  . Marital status: Married    Spouse name: Not on file  . Number of children: Not on file  . Years of education: Not on file  . Highest education level: Not on file  Occupational History  . Not on file  Social Needs  . Financial resource strain: Not on file  . Food insecurity:    Worry: Not on file    Inability: Not on file  . Transportation needs:    Medical: Not on file    Non-medical: Not on file  Tobacco Use  . Smoking status: Never Smoker  . Smokeless tobacco: Never Used  Substance and Sexual Activity  . Alcohol use: Yes    Alcohol/week: 0.0 oz    Comment: 1  . Drug use: No  . Sexual activity: Yes    Partners: Male    Birth control/protection: Surgical    Comment: BTL  Lifestyle  . Physical activity:    Days per week: Not on file    Minutes per session: Not on file  . Stress: Not on file  Relationships  . Social connections:    Talks on phone: Not on file    Gets together: Not on file    Attends religious service: Not on file    Active member of club or organization: Not on file    Attends meetings of clubs or organizations: Not on file    Relationship status: Not on file  Other Topics Concern  . Not on file  Social History Narrative  . Not on file   No Known Allergies Family History  Problem Relation Age of Onset  . Diabetes Paternal Grandmother   . Hypertension Paternal Grandmother   . Stroke Paternal Grandfather   . Congestive Heart Failure Maternal Grandfather   . Colon polyps Maternal Grandfather   . Colon polyps Mother   . Colon cancer Mother 67     Past medical history, social, surgical and family history all reviewed in electronic medical record.  No pertanent information unless stated regarding to the chief complaint.  Review of Systems:Review of systems updated and as accurate as of 02/27/18  No headache, visual changes, nausea, vomiting, diarrhea, constipation, dizziness, abdominal pain, skin rash, fevers, chills, night sweats, weight loss, swollen lymph nodes, body aches, joint swelling, muscle aches, chest pain, shortness of breath, mood changes.   Objective  Blood pressure 100/60, pulse 76, height 5\' 1"  (1.549 m), weight 127 lb (57.6 kg), SpO2 98 %. Systems examined below as of 02/27/18   General: No apparent distress alert and oriented x3 mood and affect normal, dressed appropriately.  HEENT: Pupils equal, extraocular movements intact  Respiratory: Patient's speak in full sentences and does not appear short of breath  Cardiovascular: No lower extremity edema, non tender, no erythema  Skin: Warm dry intact with no signs of infection or rash on extremities or on axial skeleton.  Abdomen: Soft nontender    Neuro: Cranial nerves II through XII are intact, neurovascularly intact in all extremities with 2+ DTRs and 2+ pulses.  Lymph: No lymphadenopathy of posterior or anterior cervical chain or axillae bilaterally.  Gait normal with good balance and coordination.  MSK:  Non tender with full range of motion and good stability and symmetric strength and tone of shoulders, elbows, wrist, hip, knee and ankles bilaterally.  Back Exam:  Inspection: Loss of lordosis Motion: Flexion 45 deg, Extension 25 deg, Side Bending to 35 deg bilaterally,  Rotation to 25 deg bilaterally  SLR laying: Negative  XSLR laying: Negative  Palpable tenderness: Tender to palpation the paraspinal musculature lumbar spine left greater than right. FABER: Tightness bilaterally. Sensory change: Gross sensation intact to all lumbar and sacral dermatomes.  Reflexes: 2+ at both patellar tendons, 2+ at achilles tendons, Babinski's downgoing.  Strength at foot  Plantar-flexion: 5/5 Dorsi-flexion: 5/5 Eversion: 5/5 Inversion: 5/5  Leg strength  Quad: 5/5 Hamstring: 5/5 Hip flexor: 5/5 Hip abductors: 4/5 but symmetric Gait unremarkable.  Osteopathic findings  T4 extended rotated and side bent left L2 flexed rotated and side bent right Sacrum left on left    Impression and Recommendations:     This case required medical decision making of moderate complexity.      Note: This dictation was prepared with Dragon dictation along with smaller phrase technology. Any transcriptional errors that result from this process are unintentional.

## 2018-02-27 ENCOUNTER — Ambulatory Visit: Payer: 59 | Admitting: Family Medicine

## 2018-02-27 ENCOUNTER — Encounter: Payer: Self-pay | Admitting: Family Medicine

## 2018-02-27 ENCOUNTER — Other Ambulatory Visit: Payer: Self-pay

## 2018-02-27 VITALS — BP 100/60 | HR 76 | Ht 61.0 in | Wt 127.0 lb

## 2018-02-27 DIAGNOSIS — M545 Low back pain, unspecified: Secondary | ICD-10-CM

## 2018-02-27 DIAGNOSIS — G8929 Other chronic pain: Secondary | ICD-10-CM | POA: Diagnosis not present

## 2018-02-27 DIAGNOSIS — M999 Biomechanical lesion, unspecified: Secondary | ICD-10-CM

## 2018-02-27 MED ORDER — MELOXICAM 15 MG PO TABS: 15.0000 mg | ORAL_TABLET | Freq: Every day | ORAL | 1 refills | Status: DC

## 2018-02-27 NOTE — Patient Instructions (Signed)
Good to see you  Overall not bad Continue to be active and monitor See me again in 4 weeks

## 2018-02-27 NOTE — Assessment & Plan Note (Signed)
Discussed icing regimen and home exercises.  Discussed which activities to do which wants to avoid.  Discussed with patient again about the possibility of worsening pain in the radicular symptoms may need advanced imaging and potential injections.  Patient has been responding fairly well to conservative therapy.  Follow-up again in 4 to 8 weeks.

## 2018-02-27 NOTE — Assessment & Plan Note (Signed)
Decision today to treat with OMT was based on Physical Exam  After verbal consent patient was treated with HVLA, ME, FPR techniques in  thoracic, lumbar and sacral areas  Patient tolerated the procedure well with improvement in symptoms  Patient given exercises, stretches and lifestyle modifications  See medications in patient instructions if given  Patient will follow up in 4 weeks 

## 2018-03-22 ENCOUNTER — Other Ambulatory Visit: Payer: Self-pay | Admitting: Family Medicine

## 2018-04-02 NOTE — Progress Notes (Signed)
[name] [place] [place], [place] [address] Phone: [phone] Subjective:   [name], am serving as a scribe for Dr. Hulan Saas.   CC: Back and hip pain  EYC:[id number]  Andrea [name] is a 47 y.o. female coming in with complaint of back and hip pain deep in the joint. She feels like her hip is popping more than usual.  [place] and slept on a bed that was not supportive.  Patient is in sinus tightness overall.  Not noticing as much of the groin pain.  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  Patient states overall once again more tightness than anything else    Past Medical History:  Diagnosis Date  . 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 Arthritis of left hip    saw Dr. [name], had MRI  . Headache   . Osteopenia    Past Surgical History:  Procedure Laterality Date  . BREAST ENHANCEMENT SURGERY  4/00  . BREAST SURGERY  6/08   revision on right breast due to tight muscles  . CESAREAN SECTION    . HTA     and BTL  . OTHER SURGICAL HISTORY  10/2013   Vaginal rejuvination surgery, Dr. [name], [place]   Social History   Socioeconomic History  . Marital status: Married    Spouse name: Not on file  . Number of children: Not on file  . Years of education: Not on file  . Highest education level: Not on file  Occupational History  . Not on file  Social Needs  . Financial resource strain: Not on file  . Food insecurity:    Worry: Not on file    Inability: Not on file  . Transportation needs:    Medical: Not on file    Non-medical: Not on file  Tobacco Use  . Smoking status: Never Smoker  . Smokeless tobacco: Never Used  Substance and Sexual Activity  . Alcohol use: Yes    Alcohol/week: 0.0 standard drinks    Comment: 1  . Drug use: No  . Sexual activity: Yes    Partners: Male    Birth control/protection: Surgical    Comment: BTL    Lifestyle  . Physical activity:    Days per week: Not on file    Minutes per session: Not on file  . Stress: Not on file  Relationships  . Social connections:    Talks on phone: Not on file    Gets together: Not on file    Attends religious service: Not on file    Active member of club or organization: Not on file    Attends meetings of clubs or organizations: Not on file    Relationship status: Not on file  Other Topics Concern  . Not on file  Social History Narrative  . Not on file   No Known Allergies Family History  Problem Relation Age of Onset  . Diabetes Paternal Grandmother   . Hypertension Paternal Grandmother   . Stroke Paternal Grandfather   . Congestive Heart Failure Maternal Grandfather   . Colon polyps Maternal Grandfather   . Colon polyps Mother   . Colon cancer Mother 35    Current Outpatient Medications (Endocrine & Metabolic):  [name]  NATURE-THROID 32.5 MG tablet, Take 32.5 mg by mouth every morning. .  progesterone (PROMETRIUM) 100 MG  capsule,    Current Outpatient Medications (Respiratory):  .  levocetirizine (XYZAL) 5 MG tablet,   Current Outpatient Medications (Analgesics):  .  meloxicam (MOBIC) 15 MG tablet, Take 1 tablet (15 mg total) by mouth daily.   Current Outpatient Medications (Other):  [name]  B Complex Vitamins (B COMPLEX PO), Take by mouth. .  Calcium Carbonate (CALCIUM 600 PO), Take by mouth daily. .  Coenzyme Q10 (CO Q 10 PO), Take by mouth. .  gabapentin (NEURONTIN) 100 MG capsule, Take 2 capsules (200 mg total) by mouth at bedtime. .  Multiple Vitamins-Minerals (MULTIVITAMIN PO), Take by mouth daily. .  NON FORMULARY, Ashwaganda .  Omega-3 Fatty Acids (FISH OIL PO), Take by mouth. .  Probiotic Product (PROBIOTIC PO), Take by mouth. .  Vitamin D, Ergocalciferol, (DRISDOL) 50000 units CAPS capsule, TAKE 1 CAPSULE BY MOUTH ONCE A WEEK    Past medical history, social, surgical and family history all reviewed in electronic medical  record.  No pertanent information unless stated regarding to the chief complaint.   Review of Systems:  No headache, visual changes, nausea, vomiting, diarrhea, constipation, dizziness, abdominal pain, skin rash, fevers, chills, night sweats, weight loss, swollen lymph nodes, body aches, joint swelling,  chest pain, shortness of breath, mood changes.  Mild positive muscle aches  Objective  Blood pressure 120/74, pulse 78, height 5\' 1"  (1.549 m), weight 138 lb (62.6 kg), SpO2 97 %.    General: No apparent distress alert and oriented x3 mood and affect normal, dressed appropriately.  HEENT: Pupils equal, extraocular movements intact  Respiratory: Patient's speak in full sentences and does not appear short of breath  Cardiovascular: No lower extremity edema, non tender, no erythema  Skin: Warm dry intact with no signs of infection or rash on extremities or on axial skeleton.  Abdomen: Soft nontender  Neuro: Cranial nerves II through XII are intact, neurovascularly intact in all extremities with 2+ DTRs and 2+ pulses.  Lymph: No lymphadenopathy of posterior or anterior cervical chain or axillae bilaterally.  Gait normal with good balance and coordination.  MSK:  Non tender with full range of motion and good stability and symmetric strength and tone of shoulders, elbows, wrist, hip, knee and ankles bilaterally.  Back Exam:  Inspection: Mild loss of lordosis Motion: Flexion 45 deg, Extension 25 deg, Side Bending to 45 deg bilaterally,  Rotation to 45 deg bilaterally  SLR laying: Negative  XSLR laying: Negative  Palpable tenderness: Tender to palpation of the left piriformis and somewhat over the paraspinal musculature of the lumbar spine. FABER: Tightness of the left. Sensory change: Gross sensation intact to all lumbar and sacral dermatomes.  Reflexes: 2+ at both patellar tendons, 2+ at achilles tendons, Babinski's downgoing.  Strength at foot  Plantar-flexion: 5/5 Dorsi-flexion: 5/5  Eversion: 5/5 Inversion: 5/5  Leg strength  Quad: 5/5 Hamstring: 5/5 Hip flexor: 5/5 Hip abductors: 5/5  Gait unremarkable.  Osteopathic findings  T6 extended rotated and side bent left L4 flexed rotated and side bent right Sacrum right on right     Impression and Recommendations:     This case required medical decision making of moderate complexity. The above documentation has been reviewed and is accurate and complete Lyndal Pulley, DO       Note: This dictation was prepared with Dragon dictation along with smaller phrase technology. Any transcriptional errors that result from this process are unintentional.

## 2018-04-03 ENCOUNTER — Encounter: Payer: Self-pay | Admitting: Family Medicine

## 2018-04-03 ENCOUNTER — Ambulatory Visit: Payer: 59 | Admitting: Family Medicine

## 2018-04-03 VITALS — BP 120/74 | HR 78 | Ht 61.0 in | Wt 138.0 lb

## 2018-04-03 DIAGNOSIS — M9904 Segmental and somatic dysfunction of sacral region: Secondary | ICD-10-CM

## 2018-04-03 DIAGNOSIS — G8929 Other chronic pain: Secondary | ICD-10-CM

## 2018-04-03 DIAGNOSIS — M9903 Segmental and somatic dysfunction of lumbar region: Secondary | ICD-10-CM | POA: Diagnosis not present

## 2018-04-03 DIAGNOSIS — M9902 Segmental and somatic dysfunction of thoracic region: Secondary | ICD-10-CM

## 2018-04-03 DIAGNOSIS — M999 Biomechanical lesion, unspecified: Secondary | ICD-10-CM | POA: Diagnosis not present

## 2018-04-03 DIAGNOSIS — M545 Low back pain: Secondary | ICD-10-CM

## 2018-04-03 NOTE — Assessment & Plan Note (Signed)
Low back pain.  Seems to be multifactorial.  Does have some arthritis of the left hip.  I do think patient compensates for this on a regular basis.  We discussed icing regimen and home exercise.  Discussed which activities to do which wants to avoid.  Patient continues to workout on a regular basis.  Not affecting daily activities on the regular.  Follow-up again in 4 to 8 weeks

## 2018-04-03 NOTE — Patient Instructions (Signed)
Good to see you  Ice is your friend Really tight and get back to stretching  See me again in 4 weeks

## 2018-04-03 NOTE — Assessment & Plan Note (Addendum)
Decision today to treat with OMT was based on Physical Exam  After verbal consent patient was treated with HVLA, ME, FPR techniques in  thoracic, lumbar and sacral areas  Patient tolerated the procedure well with improvement in symptoms  Patient given exercises, stretches and lifestyle modifications  See medications in patient instructions if given  Patient will follow up in 4-8 weeks 

## 2018-04-09 ENCOUNTER — Other Ambulatory Visit: Payer: Self-pay | Admitting: Family Medicine

## 2018-05-03 ENCOUNTER — Ambulatory Visit: Payer: 59 | Admitting: Family Medicine

## 2018-05-10 ENCOUNTER — Ambulatory Visit: Payer: 59 | Admitting: Family Medicine

## 2018-05-10 ENCOUNTER — Encounter: Payer: Self-pay | Admitting: Family Medicine

## 2018-05-10 VITALS — BP 116/76 | HR 70 | Ht 61.0 in | Wt 138.0 lb

## 2018-05-10 DIAGNOSIS — M545 Low back pain, unspecified: Secondary | ICD-10-CM

## 2018-05-10 DIAGNOSIS — M9904 Segmental and somatic dysfunction of sacral region: Secondary | ICD-10-CM

## 2018-05-10 DIAGNOSIS — M9902 Segmental and somatic dysfunction of thoracic region: Secondary | ICD-10-CM | POA: Diagnosis not present

## 2018-05-10 DIAGNOSIS — M9903 Segmental and somatic dysfunction of lumbar region: Secondary | ICD-10-CM

## 2018-05-10 DIAGNOSIS — G8929 Other chronic pain: Secondary | ICD-10-CM

## 2018-05-10 DIAGNOSIS — M999 Biomechanical lesion, unspecified: Secondary | ICD-10-CM

## 2018-05-10 NOTE — Progress Notes (Signed)
[name] [address] [place], [name] Phone: [phone] Subjective:   [name], am serving as a scribe for Dr. Hulan Saas.  I'm seeing this patient by the request  of:    CC: Back pain and hip pain follow-up  [id number]  Andrea [name] is a 47 y.o. female coming in with complaint of back and hip pain. Her back is better but her hip pain continues. She did get a contour pillow that she uses between her knees. She said that this has helped her to have less pain during the night. Does still have continued pain during the day due to a labral tear in her hip. Stays active using the [employer] and rowing machine at home.       Past Medical History:  Diagnosis Date  . 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 Arthritis of left hip    saw Dr. [name], had MRI  . Headache   . Osteopenia    Past Surgical History:  Procedure Laterality Date  . BREAST ENHANCEMENT SURGERY  4/00  . BREAST SURGERY  6/08   revision on right breast due to tight muscles  . CESAREAN SECTION    . HTA     and BTL  . OTHER SURGICAL HISTORY  10/2013   Vaginal rejuvination surgery, Dr. [name], [place]   Social History   Socioeconomic History  . Marital status: Married    Spouse name: Not on file  . Number of children: Not on file  . Years of education: Not on file  . Highest education level: Not on file  Occupational History  . Not on file  Social Needs  . Financial resource strain: Not on file  . Food insecurity:    Worry: Not on file    Inability: Not on file  . Transportation needs:    Medical: Not on file    Non-medical: Not on file  Tobacco Use  . Smoking status: Never Smoker  . Smokeless tobacco: Never Used  Substance and Sexual Activity  . Alcohol use: Yes    Alcohol/week: 0.0 standard drinks    Comment: 1  . Drug use: No  . Sexual activity: Yes    Partners: Male    Birth control/protection: Surgical    Comment: BTL  Lifestyle   . Physical activity:    Days per week: Not on file    Minutes per session: Not on file  . Stress: Not on file  Relationships  . Social connections:    Talks on phone: Not on file    Gets together: Not on file    Attends religious service: Not on file    Active member of club or organization: Not on file    Attends meetings of clubs or organizations: Not on file    Relationship status: Not on file  Other Topics Concern  . Not on file  Social History Narrative  . Not on file   No Known Allergies Family History  Problem Relation Age of Onset  . Diabetes Paternal Grandmother   . Hypertension Paternal Grandmother   . Stroke Paternal Grandfather   . Congestive Heart Failure Maternal Grandfather   . Colon polyps Maternal Grandfather   . Colon polyps Mother   . Colon cancer Mother 38    Current Outpatient Medications (Endocrine & Metabolic):  [name]  NATURE-THROID 32.5 MG tablet, Take 32.5 mg by mouth every morning. .  progesterone (PROMETRIUM) 100  MG capsule,    Current Outpatient Medications (Respiratory):  .  levocetirizine (XYZAL) 5 MG tablet,   Current Outpatient Medications (Analgesics):  .  meloxicam (MOBIC) 15 MG tablet, Take 1 tablet (15 mg total) by mouth daily.   Current Outpatient Medications (Other):  [name]  B Complex Vitamins (B COMPLEX PO), Take by mouth. .  Calcium Carbonate (CALCIUM 600 PO), Take by mouth daily. .  Coenzyme Q10 (CO Q 10 PO), Take by mouth. .  gabapentin (NEURONTIN) 100 MG capsule, TAKE 2 CAPSULES BY MOUTH ONCE DAILY AT BEDTIME .  Multiple Vitamins-Minerals (MULTIVITAMIN PO), Take by mouth daily. .  NON FORMULARY, Ashwaganda .  Omega-3 Fatty Acids (FISH OIL PO), Take by mouth. .  Probiotic Product (PROBIOTIC PO), Take by mouth. .  Vitamin D, Ergocalciferol, (DRISDOL) 50000 units CAPS capsule, TAKE 1 CAPSULE BY MOUTH ONCE A WEEK    Past medical history, social, surgical and family history all reviewed in electronic medical record.  No pertanent  information unless stated regarding to the chief complaint.   Review of Systems:  No headache, visual changes, nausea, vomiting, diarrhea, constipation, dizziness, abdominal pain, skin rash, fevers, chills, night sweats, weight loss, swollen lymph nodes, body aches, joint swelling, muscle aches, chest pain, shortness of breath, mood changes.   Objective  Blood pressure 116/76, pulse 70, height 5\' 1"  (1.549 m), weight 138 lb (62.6 kg), SpO2 96 %.    General: No apparent distress alert and oriented x3 mood and affect normal, dressed appropriately.  HEENT: Pupils equal, extraocular movements intact  Respiratory: Patient's speak in full sentences and does not appear short of breath  Cardiovascular: No lower extremity edema, non tender, no erythema  Skin: Warm dry intact with no signs of infection or rash on extremities or on axial skeleton.  Abdomen: Soft nontender  Neuro: Cranial nerves II through XII are intact, neurovascularly intact in all extremities with 2+ DTRs and 2+ pulses.  Lymph: No lymphadenopathy of posterior or anterior cervical chain or axillae bilaterally.  Gait normal with good balance and coordination.  MSK:  Non tender with full range of motion and good stability and symmetric strength and tone of shoulders, elbows, wrist, hip, knee and ankles bilaterally.  Back Exam:  Inspection: Loss of lordosis Motion: Flexion 40 deg, Extension 25 deg, Side Bending to 35 deg bilaterally,  Rotation to 45 deg bilaterally  SLR laying: Negative  XSLR laying: Negative  Palpable tenderness: Tender to palpation the paraspinal musculature lumbar spine left greater than right.[name] FABER: Positive left with also decrease in internal range of motion of the hip. Sensory change: Gross sensation intact to all lumbar and sacral dermatomes.  Reflexes: 2+ at both patellar tendons, 2+ at achilles tendons, Babinski's downgoing.  Strength at foot  Plantar-flexion: 5/5 Dorsi-flexion: 5/5 Eversion: 5/5  Inversion: 5/5  Leg strength  Quad: 5/5 Hamstring: 5/5 Hip flexor: 5/5 Hip abductors: 5/5  Gait unremarkable.  Osteopathic findings  C7 flexed rotated and side bent left T3 extended rotated and side bent right inhaled third rib T9 extended rotated and side bent left L2 flexed rotated and side bent right Sacrum left on left    Impression and Recommendations:     This case required medical decision making of moderate complexity. The above documentation has been reviewed and is accurate and complete Lyndal Pulley, DO       Note: This dictation was prepared with Dragon dictation along with smaller phrase technology. Any transcriptional errors that result from this process are unintentional.

## 2018-05-10 NOTE — Assessment & Plan Note (Signed)
Decision today to treat with OMT was based on Physical Exam  After verbal consent patient was treated with HVLA, ME, FPR techniques in  thoracic, rib, lumbar and sacral areas  Patient tolerated the procedure well with improvement in symptoms  Patient given exercises, stretches and lifestyle modifications  See medications in patient instructions if given  Patient will follow up in 4-8 weeks

## 2018-05-10 NOTE — Assessment & Plan Note (Signed)
Low back pain is likely compensating for the arthritis of the left hip.  Discussed icing regimen and home exercise.  Discussed the core strengthening and.  Patient does not have a significant amount of muscle imbalances.  Continue same medications.  Responds well to manipulation.  Follow-up again in 4 to 8 weeks

## 2018-05-10 NOTE — Patient Instructions (Signed)
Good to see you  Ice is your friend Keep up with the pillow Radio frequency ablation

## 2018-06-03 NOTE — Progress Notes (Signed)
[name] [place], [place] [address] Phone: [phone] Subjective:   [name], am serving as a scribe for Dr. Hulan Saas.  I'm seeing this patient by the request  of:    CC: Back pain and hip pain follow-up  [id number]  Andrea [name] is a 47 y.o. female coming in with complaint of back pain. Continues to have good and bad days. Irritated with exercise and irritated  when she is not exercising. Wants to know if she should continue working on her flexibility.  Found to have arthritic changes noted of the hip as well with a labral tear.  Patient has been doing relatively well overall now.  Patient states some tightness.  Has been trying to increase range of motion that seems to be aggravating it a little more though.    Past Medical History:  Diagnosis Date  . 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 Arthritis of left hip    saw Dr. [name], had MRI  . Headache   . Osteopenia    Past Surgical History:  Procedure Laterality Date  . BREAST ENHANCEMENT SURGERY  4/00  . BREAST SURGERY  6/08   revision on right breast due to tight muscles  . CESAREAN SECTION    . HTA     and BTL  . OTHER SURGICAL HISTORY  10/2013   Vaginal rejuvination surgery, Dr. [name], [place]   Social History   Socioeconomic History  . Marital status: Married    Spouse name: Not on file  . Number of children: Not on file  . Years of education: Not on file  . Highest education level: Not on file  Occupational History  . Not on file  Social Needs  . Financial resource strain: Not on file  . Food insecurity:    Worry: Not on file    Inability: Not on file  . Transportation needs:    Medical: Not on file    Non-medical: Not on file  Tobacco Use  . Smoking status: Never Smoker  . Smokeless tobacco: Never Used  Substance and Sexual Activity  . Alcohol use: Yes    Alcohol/week: 0.0 standard drinks    Comment: 1  . Drug use: No  .  Sexual activity: Yes    Partners: Male    Birth control/protection: Surgical    Comment: BTL  Lifestyle  . Physical activity:    Days per week: Not on file    Minutes per session: Not on file  . Stress: Not on file  Relationships  . Social connections:    Talks on phone: Not on file    Gets together: Not on file    Attends religious service: Not on file    Active member of club or organization: Not on file    Attends meetings of clubs or organizations: Not on file    Relationship status: Not on file  Other Topics Concern  . Not on file  Social History Narrative  . Not on file   No Known Allergies Family History  Problem Relation Age of Onset  . Diabetes Paternal Grandmother   . Hypertension Paternal Grandmother   . Stroke Paternal Grandfather   . Congestive Heart Failure Maternal Grandfather   . Colon polyps Maternal Grandfather   . Colon polyps Mother   . Colon cancer Mother 52    Current Outpatient Medications (Endocrine & Metabolic):  [name]  NATURE-THROID 32.5 MG tablet,  Take 32.5 mg by mouth every morning. .  progesterone (PROMETRIUM) 100 MG capsule,    Current Outpatient Medications (Respiratory):  .  levocetirizine (XYZAL) 5 MG tablet,   Current Outpatient Medications (Analgesics):  .  meloxicam (MOBIC) 15 MG tablet, Take 1 tablet (15 mg total) by mouth daily.   Current Outpatient Medications (Other):  [name]  B Complex Vitamins (B COMPLEX PO), Take by mouth. .  Calcium Carbonate (CALCIUM 600 PO), Take by mouth daily. .  Coenzyme Q10 (CO Q 10 PO), Take by mouth. .  gabapentin (NEURONTIN) 100 MG capsule, Take 2 capsules (200 mg total) by mouth at bedtime. .  Multiple Vitamins-Minerals (MULTIVITAMIN PO), Take by mouth daily. .  NON FORMULARY, Ashwaganda .  Omega-3 Fatty Acids (FISH OIL PO), Take by mouth. .  Probiotic Product (PROBIOTIC PO), Take by mouth. .  Vitamin D, Ergocalciferol, (DRISDOL) 50000 units CAPS capsule, TAKE 1 CAPSULE BY MOUTH ONCE A  WEEK    Past medical history, social, surgical and family history all reviewed in electronic medical record.  No pertanent information unless stated regarding to the chief complaint.   Review of Systems:  No headache, visual changes, nausea, vomiting, diarrhea, constipation, dizziness, abdominal pain, skin rash, fevers, chills, night sweats, weight loss, swollen lymph nodes, body aches, joint swelling, m chest pain, shortness of breath, mood changes.  Positive muscle aches  Objective  Blood pressure 110/70, pulse 92, height 5\' 1"  (1.549 m), weight 138 lb (62.6 kg), SpO2 98 %.    General: No apparent distress alert and oriented x3 mood and affect normal, dressed appropriately.  HEENT: Pupils equal, extraocular movements intact  Respiratory: Patient's speak in full sentences and does not appear short of breath  Cardiovascular: No lower extremity edema, non tender, no erythema  Skin: Warm dry intact with no signs of infection or rash on extremities or on axial skeleton.  Abdomen: Soft nontender  Neuro: Cranial nerves II through XII are intact, neurovascularly intact in all extremities with 2+ DTRs and 2+ pulses.  Lymph: No lymphadenopathy of posterior or anterior cervical chain or axillae bilaterally.  Gait mild antalgic MSK:  Non tender with full range of motion and good stability and symmetric strength and tone of shoulders, elbows, wrist, hip, knee and ankles bilaterally.  Back Exam:  Inspection: Unremarkable  Motion: Flexion 40 deg, Extension 25 deg, Side Bending to 35 deg bilaterally,  Rotation to 45 deg bilaterally  SLR laying: Negative  XSLR laying: Negative  Palpable tenderness: Tender to palpation over the sacroiliac joints bilaterally.  Mild pain over the piriformis as well.  Mild pain over the pubic symphysis also noted.[name] FABER: tightness. Sensory change: Gross sensation intact to all lumbar and sacral dermatomes.  Reflexes: 2+ at both patellar tendons, 2+ at achilles tendons,  Babinski's downgoing.  Strength at foot  Plantar-flexion: 5/5 Dorsi-flexion: 5/5 Eversion: 5/5 Inversion: 5/5  Leg strength  Quad: 5/5 Hamstring: 5/5 Hip flexor: 5/5 Hip abductors: 5/5  Gait unremarkable.   Osteopathic findings T7 extended rotated and side bent left L4 flexed rotated and side bent left  Sacrum right on right    Impression and Recommendations:     This case required medical decision making of moderate complexity. The above documentation has been reviewed and is accurate and complete Lyndal Pulley, DO       Note: This dictation was prepared with Dragon dictation along with smaller phrase technology. Any transcriptional errors that result from this process are unintentional.

## 2018-06-04 ENCOUNTER — Encounter: Payer: Self-pay | Admitting: Family Medicine

## 2018-06-04 ENCOUNTER — Ambulatory Visit: Payer: 59 | Admitting: Family Medicine

## 2018-06-04 VITALS — BP 110/70 | HR 92 | Ht 61.0 in | Wt 138.0 lb

## 2018-06-04 DIAGNOSIS — M999 Biomechanical lesion, unspecified: Secondary | ICD-10-CM

## 2018-06-04 DIAGNOSIS — G8929 Other chronic pain: Secondary | ICD-10-CM

## 2018-06-04 DIAGNOSIS — M545 Low back pain, unspecified: Secondary | ICD-10-CM

## 2018-06-04 MED ORDER — GABAPENTIN 100 MG PO CAPS: 200.0000 mg | ORAL_CAPSULE | Freq: Every day | ORAL | 3 refills | Status: DC

## 2018-06-04 NOTE — Assessment & Plan Note (Signed)
Low back pain.  Does have the arthritis of the left hip.  Patient is continue with conservative therapy.  Patient is responding well to manipulation.  Encourage patient to continue to avoid high impact testing exercises.  Follow-up again in 4 to 8 weeks

## 2018-06-04 NOTE — Patient Instructions (Signed)
Good to see you  Ice is your friend Stay active Refilled the gabapentin  More stretching decreases stability  Listen to your body  Andrea [name] at [place] [place] office See em again in 5-6 weeks

## 2018-06-04 NOTE — Assessment & Plan Note (Addendum)
Decision today to treat with OMT was based on Physical Exam  After verbal consent patient was treated with HVLA, ME, FPR techniques in  thoracic, lumbar and sacral areas  Patient tolerated the procedure well with improvement in symptoms  Patient given exercises, stretches and lifestyle modifications  See medications in patient instructions if given  Patient will follow up in 4-8 weeks 

## 2018-06-07 ENCOUNTER — Ambulatory Visit: Payer: 59 | Admitting: Family Medicine

## 2018-07-09 NOTE — Progress Notes (Signed)
[name] [address] [place], [place] [address] Phone: [phone] Subjective:   [name], am serving as a scribe for Dr. Hulan Saas.   CC: Hip and back pain follow-up  ION:[id number]  Andrea [name] is a 47 y.o. female coming in with complaint of hip and back pain. She has not had any change in her pain since last visit. Is here for OMT to manage her pain.  Patient states that been doing relatively well.  Did try to run and unfortunately had 2 days of worsening pain.  Been back to her baseline.     Past Medical History:  Diagnosis Date  . 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 Arthritis of left hip    saw Dr. [name], had MRI  . Headache   . Osteopenia    Past Surgical History:  Procedure Laterality Date  . BREAST ENHANCEMENT SURGERY  4/00  . BREAST SURGERY  6/08   revision on right breast due to tight muscles  . CESAREAN SECTION    . HTA     and BTL  . OTHER SURGICAL HISTORY  10/2013   Vaginal rejuvination surgery, Dr. [name], [place]   Social History   Socioeconomic History  . Marital status: Married    Spouse name: Not on file  . Number of children: Not on file  . Years of education: Not on file  . Highest education level: Not on file  Occupational History  . Not on file  Social Needs  . Financial resource strain: Not on file  . Food insecurity:    Worry: Not on file    Inability: Not on file  . Transportation needs:    Medical: Not on file    Non-medical: Not on file  Tobacco Use  . Smoking status: Never Smoker  . Smokeless tobacco: Never Used  Substance and Sexual Activity  . Alcohol use: Yes    Alcohol/week: 0.0 standard drinks    Comment: 1  . Drug use: No  . Sexual activity: Yes    Partners: Male    Birth control/protection: Surgical    Comment: BTL  Lifestyle  . Physical activity:    Days per week: Not on file    Minutes per session: Not on file  . Stress: Not on file  Relationships  .  Social connections:    Talks on phone: Not on file    Gets together: Not on file    Attends religious service: Not on file    Active member of club or organization: Not on file    Attends meetings of clubs or organizations: Not on file    Relationship status: Not on file  Other Topics Concern  . Not on file  Social History Narrative  . Not on file   No Known Allergies Family History  Problem Relation Age of Onset  . Diabetes Paternal Grandmother   . Hypertension Paternal Grandmother   . Stroke Paternal Grandfather   . Congestive Heart Failure Maternal Grandfather   . Colon polyps Maternal Grandfather   . Colon polyps Mother   . Colon cancer Mother 110    Current Outpatient Medications (Endocrine & Metabolic):  [name]  NATURE-THROID 32.5 MG tablet, Take 32.5 mg by mouth every morning. .  progesterone (PROMETRIUM) 100 MG capsule,    Current Outpatient Medications (Respiratory):  .  levocetirizine (XYZAL) 5 MG tablet,   Current Outpatient Medications (Analgesics):  .  meloxicam (MOBIC)  15 MG tablet, Take 1 tablet (15 mg total) by mouth daily.   Current Outpatient Medications (Other):  [name]  B Complex Vitamins (B COMPLEX PO), Take by mouth. .  Calcium Carbonate (CALCIUM 600 PO), Take by mouth daily. .  Coenzyme Q10 (CO Q 10 PO), Take by mouth. .  gabapentin (NEURONTIN) 100 MG capsule, Take 2 capsules (200 mg total) by mouth at bedtime. .  Multiple Vitamins-Minerals (MULTIVITAMIN PO), Take by mouth daily. .  NON FORMULARY, Ashwaganda .  Omega-3 Fatty Acids (FISH OIL PO), Take by mouth. .  Probiotic Product (PROBIOTIC PO), Take by mouth. .  Vitamin D, Ergocalciferol, (DRISDOL) 50000 units CAPS capsule, TAKE 1 CAPSULE BY MOUTH ONCE A WEEK    Past medical history, social, surgical and family history all reviewed in electronic medical record.  No pertanent information unless stated regarding to the chief complaint.   Review of Systems:  No headache, visual changes, nausea, vomiting,  diarrhea, constipation, dizziness, abdominal pain, skin rash, fevers, chills, night sweats, weight loss, swollen lymph nodes, body aches, joint swelling, m chest pain, shortness of breath, mood changes.  Positive muscle aches  Objective  Blood pressure 110/72, pulse 78, height 5\' 1"  (1.549 m), SpO2 98 %.    General: No apparent distress alert and oriented x3 mood and affect normal, dressed appropriately.  HEENT: Pupils equal, extraocular movements intact  Respiratory: Patient's speak in full sentences and does not appear short of breath  Cardiovascular: No lower extremity edema, non tender, no erythema  Skin: Warm dry intact with no signs of infection or rash on extremities or on axial skeleton.  Abdomen: Soft nontender  Neuro: Cranial nerves II through XII are intact, neurovascularly intact in all extremities with 2+ DTRs and 2+ pulses.  Lymph: No lymphadenopathy of posterior or anterior cervical chain or axillae bilaterally.  Gait mild antalgic MSK:  Non tender with full range of motion and good stability and symmetric strength and tone of shoulders, elbows, wrist,  knee and ankles bilaterally.  Patient's hip does have limited range of motion laterally most on the left side lacking last 10 degrees of internal extension  Osteopathic findings  T4 extended rotated and side bent left L2 flexed rotated and side bent right L4 flexed rotated side bent left Sacrum right on right      Impression and Recommendations:     This case required medical decision making of moderate complexity. The above documentation has been reviewed and is accurate and complete Lyndal Pulley, DO       Note: This dictation was prepared with Dragon dictation along with smaller phrase technology. Any transcriptional errors that result from this process are unintentional.

## 2018-07-10 ENCOUNTER — Ambulatory Visit: Payer: 59 | Admitting: Family Medicine

## 2018-07-10 ENCOUNTER — Encounter: Payer: Self-pay | Admitting: Family Medicine

## 2018-07-10 VITALS — BP 110/72 | HR 78 | Ht 61.0 in

## 2018-07-10 DIAGNOSIS — M545 Low back pain, unspecified: Secondary | ICD-10-CM

## 2018-07-10 DIAGNOSIS — G8929 Other chronic pain: Secondary | ICD-10-CM | POA: Diagnosis not present

## 2018-07-10 DIAGNOSIS — M999 Biomechanical lesion, unspecified: Secondary | ICD-10-CM | POA: Diagnosis not present

## 2018-07-10 NOTE — Assessment & Plan Note (Signed)
Low back pain.  Discussed posture and ergonomics.  Discussed which activities to do which wants to avoid.  Patient will follow-up with me again in 4 weeks

## 2018-07-10 NOTE — Patient Instructions (Signed)
Good to see you  Ice 20 minutes 2 times daily. Usually after activity and before bed. No running really ever ;) To make it easier see me again in 4 weeks  Happy holidays!

## 2018-07-10 NOTE — Assessment & Plan Note (Signed)
Decision today to treat with OMT was based on Physical Exam  After verbal consent patient was treated with HVLA, ME, FPR techniques in  thoracic, lumbar and sacral areas  Patient tolerated the procedure well with improvement in symptoms  Patient given exercises, stretches and lifestyle modifications  See medications in patient instructions if given  Patient will follow up in 4 weeks 

## 2018-08-09 NOTE — Progress Notes (Signed)
[name] [address] [place], [place] [address] Phone: [phone] Subjective:   Andrea [name], am serving as a scribe for Dr. Hulan Saas.   CC: Back pain follow-up  JOA:[id number]  Andrea [name] is a 48 y.o. female coming in with complaint of back pain. She had a hard leg workout last night and is sore today in there back.  Patient has also a hip arthritis that is mild to moderate on the left side as well as a labral tear.      Past Medical History:  Diagnosis Date  . 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 Arthritis of left hip    saw Dr. [name], had MRI  . Headache   . Osteopenia    Past Surgical History:  Procedure Laterality Date  . BREAST ENHANCEMENT SURGERY  4/00  . BREAST SURGERY  6/08   revision on right breast due to tight muscles  . CESAREAN SECTION    . HTA     and BTL  . OTHER SURGICAL HISTORY  10/2013   Vaginal rejuvination surgery, Dr. [name], [place]   Social History   Socioeconomic History  . Marital status: Married    Spouse name: Not on file  . Number of children: Not on file  . Years of education: Not on file  . Highest education level: Not on file  Occupational History  . Not on file  Social Needs  . Financial resource strain: Not on file  . Food insecurity:    Worry: Not on file    Inability: Not on file  . Transportation needs:    Medical: Not on file    Non-medical: Not on file  Tobacco Use  . Smoking status: Never Smoker  . Smokeless tobacco: Never Used  Substance and Sexual Activity  . Alcohol use: Yes    Alcohol/week: 0.0 standard drinks    Comment: 1  . Drug use: [name]  . Sexual activity: Yes    Partners: Male    Birth control/protection: Surgical    Comment: BTL  Lifestyle  . Physical activity:    Days per week: Not on file    Minutes per session: Not on file  . Stress: Not on file  Relationships  . Social connections:    Talks on phone: Not on file    Gets together: Not on  file    Attends religious service: Not on file    Active member of club or organization: Not on file    Attends meetings of clubs or organizations: Not on file    Relationship status: Not on file  Other Topics Concern  . Not on file  Social History Narrative  . Not on file   [name] Known Allergies Family History  Problem Relation Age of Onset  . Diabetes Paternal Grandmother   . Hypertension Paternal Grandmother   . Stroke Paternal Grandfather   . Congestive Heart Failure Maternal Grandfather   . Colon polyps Maternal Grandfather   . Colon polyps Mother   . Colon cancer Mother 22    Current Outpatient Medications (Endocrine & Metabolic):  [name]  NATURE-THROID 32.5 MG tablet, Take 32.5 mg by mouth every morning. .  progesterone (PROMETRIUM) 100 MG capsule,    Current Outpatient Medications (Respiratory):  .  levocetirizine (XYZAL) 5 MG tablet,   Current Outpatient Medications (Analgesics):  .  meloxicam (MOBIC) 15 MG tablet, Take 1 tablet (15 mg total) by mouth daily.  Current Outpatient Medications (Other):  [name]  B Complex Vitamins (B COMPLEX PO), Take by mouth. .  Calcium Carbonate (CALCIUM 600 PO), Take by mouth daily. .  Coenzyme Q10 (CO Q 10 PO), Take by mouth. .  gabapentin (NEURONTIN) 100 MG capsule, Take 2 capsules (200 mg total) by mouth at bedtime. .  Multiple Vitamins-Minerals (MULTIVITAMIN PO), Take by mouth daily. .  NON FORMULARY, Ashwaganda .  Omega-3 Fatty Acids (FISH OIL PO), Take by mouth. .  Probiotic Product (PROBIOTIC PO), Take by mouth. .  Vitamin D, Ergocalciferol, (DRISDOL) 50000 units CAPS capsule, TAKE 1 CAPSULE BY MOUTH ONCE A WEEK    Past medical history, social, surgical and family history all reviewed in electronic medical record.  [name] pertanent information unless stated regarding to the chief complaint.   Review of Systems:  [name], visual changes, nausea, vomiting, diarrhea, constipation, dizziness, abdominal pain, skin rash, fevers, chills,  night sweats, weight loss, swollen lymph nodes, body aches, joint swelling,  chest pain, shortness of breath, mood changes.  Positive muscle aches  Objective  Blood pressure 118/88, pulse 81, height 5\' 1"  (1.549 m), weight 138 lb (62.6 kg), SpO2 97 %.    General: [name] apparent distress alert and oriented x3 mood and affect normal, dressed appropriately.  HEENT: Pupils equal, extraocular movements intact  Respiratory: Patient's speak in full sentences and does not appear short of breath  Cardiovascular: [name] lower extremity edema, non tender, [name] erythema  Skin: Warm dry intact with [name] signs of infection or rash on extremities or on axial skeleton.  Abdomen: Soft nontender  Neuro: Cranial nerves II through XII are intact, neurovascularly intact in all extremities with 2+ DTRs and 2+ pulses.  Lymph: [name] lymphadenopathy of posterior or anterior cervical chain or axillae bilaterally.  Gait normal with good balance and coordination.  MSK:  Non tender with full range of motion and good stability and symmetric strength and tone of shoulders, elbows, wrist, , knee and ankles bilaterally.  Left hip shows some mild limited range of motion in all planes of 5 degrees Back exam shows some tightness.  Some mild loss of lordosis of the lumbar spine with some increased lordosis right at the sacrum.  Patient does have tenderness more on the left side of the paraspinal musculature.  Mild positive Faber test.  Negative straight leg test.  Osteopathic findings C2 flexed rotated and side bent right T3 extended rotated and side bent right inhaled third rib T7 extended rotated and side bent left L2 flexed rotated and side bent right Sacrum right on right    Impression and Recommendations:     This case required medical decision making of moderate complexity. The above documentation has been reviewed and is accurate and complete Lyndal Pulley, DO       Note: This dictation was prepared with Dragon dictation  along with smaller phrase technology. Any transcriptional errors that result from this process are unintentional.

## 2018-08-10 ENCOUNTER — Ambulatory Visit: Payer: 59 | Admitting: Family Medicine

## 2018-08-10 ENCOUNTER — Encounter: Payer: Self-pay | Admitting: Family Medicine

## 2018-08-10 VITALS — BP 118/88 | HR 81 | Ht 61.0 in | Wt 138.0 lb

## 2018-08-10 DIAGNOSIS — G8929 Other chronic pain: Secondary | ICD-10-CM | POA: Diagnosis not present

## 2018-08-10 DIAGNOSIS — M999 Biomechanical lesion, unspecified: Secondary | ICD-10-CM

## 2018-08-10 DIAGNOSIS — M545 Low back pain: Secondary | ICD-10-CM

## 2018-08-10 NOTE — Assessment & Plan Note (Signed)
Decision today to treat with OMT was based on Physical Exam  After verbal consent patient was treated with HVLA, ME, FPR techniques in cervical, thoracic, rib,  lumbar and sacral areas  Patient tolerated the procedure well with improvement in symptoms  Patient given exercises, stretches and lifestyle modifications  See medications in patient instructions if given  Patient will follow up in 4-8 weeks 

## 2018-08-10 NOTE — Assessment & Plan Note (Signed)
Patient does have low back pain.  Discussed icing regimen and home exercise.  Discussed which activities to do which wants to avoid.  Discussed with patient to continue to work on the hip abductor standing isometric exercises.  Follow-up again in 4 to 6 weeks

## 2018-08-10 NOTE — Patient Instructions (Signed)
God to see you  You are awesome Sorry you hate me ;) See me again in 4-6 weeks!

## 2018-09-11 ENCOUNTER — Other Ambulatory Visit: Payer: Self-pay | Admitting: Family Medicine

## 2018-09-12 ENCOUNTER — Ambulatory Visit: Payer: 59 | Admitting: Family Medicine

## 2018-09-12 ENCOUNTER — Encounter: Payer: Self-pay | Admitting: Family Medicine

## 2018-09-12 VITALS — BP 134/70 | HR 74 | Ht 61.0 in | Wt 140.0 lb

## 2018-09-12 DIAGNOSIS — M999 Biomechanical lesion, unspecified: Secondary | ICD-10-CM

## 2018-09-12 DIAGNOSIS — M1612 Unilateral primary osteoarthritis, left hip: Secondary | ICD-10-CM

## 2018-09-12 NOTE — Assessment & Plan Note (Signed)
Known with some degenerative labral tearing.  Continue to monitor.  Patient has responded well to the osteopathic manipulation.  Staying very active.  Staying very fit.  No significant changes in management.  Follow-up again in 6 weeks.

## 2018-09-12 NOTE — Patient Instructions (Signed)
You are awesome  Stay active  See me again in 6 weeks

## 2018-09-12 NOTE — Progress Notes (Signed)
[name] [address], [place] [address] Phone: [phone] Subjective:    I Andrea [name] am serving as a Education administrator for Dr. Hulan Saas.    CC: Back pain and hip pain follow-up  MHD:[id number]    Andrea [name] is a 48 y.o. female coming in with complaint of back pain. States she is feeling about the same as usual.  Patient does have known labral tear of the hip as well as some moderate arthritis.  Has been doing well and notices though that unfortunately when any type of running seems to get a little bit worse.      Past Medical History:  Diagnosis Date  . 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 Arthritis of left hip    saw Dr. [name], had MRI  . Headache   . Osteopenia    Past Surgical History:  Procedure Laterality Date  . BREAST ENHANCEMENT SURGERY  4/00  . BREAST SURGERY  6/08   revision on right breast due to tight muscles  . CESAREAN SECTION    . HTA     and BTL  . OTHER SURGICAL HISTORY  10/2013   Vaginal rejuvination surgery, Dr. [name], [place]   Social History   Socioeconomic History  . Marital status: Married    Spouse name: Not on file  . Number of children: Not on file  . Years of education: Not on file  . Highest education level: Not on file  Occupational History  . Not on file  Social Needs  . Financial resource strain: Not on file  . Food insecurity:    Worry: Not on file    Inability: Not on file  . Transportation needs:    Medical: Not on file    Non-medical: Not on file  Tobacco Use  . Smoking status: Never Smoker  . Smokeless tobacco: Never Used  Substance and Sexual Activity  . Alcohol use: Yes    Alcohol/week: 0.0 standard drinks    Comment: 1  . Drug use: No  . Sexual activity: Yes    Partners: Male    Birth control/protection: Surgical    Comment: BTL  Lifestyle  . Physical activity:    Days per week: Not on file    Minutes per session: Not on file  . Stress: Not on file    Relationships  . Social connections:    Talks on phone: Not on file    Gets together: Not on file    Attends religious service: Not on file    Active member of club or organization: Not on file    Attends meetings of clubs or organizations: Not on file    Relationship status: Not on file  Other Topics Concern  . Not on file  Social History Narrative  . Not on file   No Known Allergies Family History  Problem Relation Age of Onset  . Diabetes Paternal Grandmother   . Hypertension Paternal Grandmother   . Stroke Paternal Grandfather   . Congestive Heart Failure Maternal Grandfather   . Colon polyps Maternal Grandfather   . Colon polyps Mother   . Colon cancer Mother 80    Current Outpatient Medications (Endocrine & Metabolic):  [name]  NATURE-THROID 32.5 MG tablet, Take 32.5 mg by mouth every morning. .  progesterone (PROMETRIUM) 100 MG capsule,    Current Outpatient Medications (Respiratory):  .  levocetirizine (XYZAL) 5 MG tablet,   Current Outpatient Medications (Analgesics):  .  meloxicam (MOBIC) 15 MG tablet, Take 1 tablet (15 mg total) by mouth daily.   Current Outpatient Medications (Other):  [name]  B Complex Vitamins (B COMPLEX PO), Take by mouth. .  Calcium Carbonate (CALCIUM 600 PO), Take by mouth daily. .  Coenzyme Q10 (CO Q 10 PO), Take by mouth. .  gabapentin (NEURONTIN) 100 MG capsule, Take 2 capsules (200 mg total) by mouth at bedtime. .  Multiple Vitamins-Minerals (MULTIVITAMIN PO), Take by mouth daily. .  NON FORMULARY, Ashwaganda .  Omega-3 Fatty Acids (FISH OIL PO), Take by mouth. .  Probiotic Product (PROBIOTIC PO), Take by mouth. .  Vitamin D, Ergocalciferol, (DRISDOL) 1.25 MG (50000 UT) CAPS capsule, TAKE 1 CAPSULE BY MOUTH ONCE A WEEK    Past medical history, social, surgical and family history all reviewed in electronic medical record.  No pertanent information unless stated regarding to the chief complaint.   Review of Systems:  No headache, visual  changes, nausea, vomiting, diarrhea, constipation, dizziness, abdominal pain, skin rash, fevers, chills, night sweats, weight loss, swollen lymph nodes, body aches, joint swelling,  chest pain, shortness of breath, mood changes.  Positive muscle aches  Objective  Blood pressure 134/70, pulse 74, height 5\' 1"  (1.549 m), weight 140 lb (63.5 kg), SpO2 97 %.   General: No apparent distress alert and oriented x3 mood and affect normal, dressed appropriately.  HEENT: Pupils equal, extraocular movements intact  Respiratory: Patient's speak in full sentences and does not appear short of breath  Cardiovascular: No lower extremity edema, non tender, no erythema  Skin: Warm dry intact with no signs of infection or rash on extremities or on axial skeleton.  Abdomen: Soft nontender  Neuro: Cranial nerves II through XII are intact, neurovascularly intact in all extremities with 2+ DTRs and 2+ pulses.  Lymph: No lymphadenopathy of posterior or anterior cervical chain or axillae bilaterally.  Gait normal with good balance and coordination.  MSK:  Non tender with full range of motion and good stability and symmetric strength and tone of shoulders, elbows, wrist, knee and ankles bilaterally.   Left hip exam shows the patient does have some mild decrease in range of motion.  More pain over the sacroiliac joint.  Mild positive [name].  Tightness of the lumbar spine  Osteopathic findings  T5 extended rotated and side bent left L1 flexed rotated and side bent right Sacrum right on right     Impression and Recommendations:     This case required medical decision making of moderate complexity. The above documentation has been reviewed and is accurate and complete Andrea Pulley, DO       Note: This dictation was prepared with Dragon dictation along with smaller phrase technology. Any transcriptional errors that result from this process are unintentional.

## 2018-09-12 NOTE — Assessment & Plan Note (Signed)
Decision today to treat with OMT was based on Physical Exam  After verbal consent patient was treated with HVLA, ME, FPR techniques in cervical, thoracic, rib lumbar and sacral areas  Patient tolerated the procedure well with improvement in symptoms  Patient given exercises, stretches and lifestyle modifications  See medications in patient instructions if given  Patient will follow up in 6 weeks 

## 2018-09-21 ENCOUNTER — Ambulatory Visit: Payer: 59 | Admitting: Obstetrics & Gynecology

## 2018-09-21 ENCOUNTER — Other Ambulatory Visit (HOSPITAL_COMMUNITY)
Admission: RE | Admit: 2018-09-21 | Discharge: 2018-09-21 | Disposition: A | Discharge status: Home / Self Care | Payer: 59 | Source: Ambulatory Visit | Attending: Obstetrics & Gynecology | Admitting: Obstetrics & Gynecology

## 2018-09-21 ENCOUNTER — Other Ambulatory Visit: Payer: Self-pay

## 2018-09-21 ENCOUNTER — Encounter: Payer: Self-pay | Admitting: Obstetrics & Gynecology

## 2018-09-21 VITALS — BP 100/78 | HR 72 | Resp 14 | Ht 61.0 in | Wt 139.4 lb

## 2018-09-21 DIAGNOSIS — Z1211 Encounter for screening for malignant neoplasm of colon: Secondary | ICD-10-CM | POA: Diagnosis not present

## 2018-09-21 DIAGNOSIS — Z0000 Encounter for general adult medical examination without abnormal findings: Secondary | ICD-10-CM | POA: Diagnosis not present

## 2018-09-21 DIAGNOSIS — Z124 Encounter for screening for malignant neoplasm of cervix: Secondary | ICD-10-CM

## 2018-09-21 DIAGNOSIS — Z01419 Encounter for gynecological examination (general) (routine) without abnormal findings: Secondary | ICD-10-CM | POA: Diagnosis not present

## 2018-09-21 DIAGNOSIS — R7989 Other specified abnormal findings of blood chemistry: Secondary | ICD-10-CM

## 2018-09-21 NOTE — Progress Notes (Signed)
48 y.o. G3P3 Married White or Caucasian female here for annual exam.  Has a torn labrum and she has been seeing Dr. Tamala Julian.  She is feeling much better.  She started 200mg  gabapentin at night.  This has really helped.  Does also have meloxican for inflammation but hasn't taken this in several months.  Continues to exercise regularly and do weight training.    Is having some joint issues.  Saw Raliegh Ip provider at first.  Felt blown off by this provider.  Denies vaginal bleeding.    Still going to [place].  Did have blood work.  Is using topical testosterone and progesterone.  Reports estradiol levels have been normal but testosterone was low.    Patient's last menstrual period was 08/08/2006 (approximate).          Sexually active: Yes.    The current method of family planning is tubal ligation.    Exercising: Yes.    weights, cardio Smoker:  no  Health Maintenance: Pap:  07/13/17 Neg  11/25/14 Neg. HR HPV:neg  History of abnormal Pap:  yes MMG:  07/26/17 BIRADS1:Neg  Colonoscopy:  Never BMD:   03/23/06 Osteopenia  TDaP:  2018 Screening Labs: Vit D    reports that she has never smoked. She has never used smokeless tobacco. She reports previous alcohol use. She reports that she does not use drugs.  Past Medical History:  Diagnosis Date  . 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 Arthritis of left hip    saw Dr. [name], had MRI  . Headache   . Osteopenia     Past Surgical History:  Procedure Laterality Date  . BREAST ENHANCEMENT SURGERY  4/00  . BREAST SURGERY  6/08   revision on right breast due to tight muscles  . CESAREAN SECTION    . HTA     and BTL  . OTHER SURGICAL HISTORY  10/2013   Vaginal rejuvination surgery, Dr. [name], [place]    Current Outpatient Medications  Medication Sig Dispense Refill  . B Complex Vitamins (B COMPLEX PO) Take by mouth.    . Calcium Carbonate (CALCIUM 600 PO) Take by mouth daily.    . Coenzyme Q10 (CO Q 10  PO) Take by mouth.    . gabapentin (NEURONTIN) 100 MG capsule Take 2 capsules (200 mg total) by mouth at bedtime. 180 capsule 3  . levocetirizine (XYZAL) 5 MG tablet     . meloxicam (MOBIC) 15 MG tablet Take 1 tablet (15 mg total) by mouth daily. 90 tablet 1  . Multiple Vitamins-Minerals (MULTIVITAMIN PO) Take by mouth daily.    [name] NATURE-THROID 32.5 MG tablet Take 32.5 mg by mouth every morning.  3  . NON FORMULARY Ashwaganda    . NONFORMULARY OR COMPOUNDED ITEM Testosterone 2% cream  1  . Omega-3 Fatty Acids (FISH OIL PO) Take by mouth.    . Probiotic Product (PROBIOTIC PO) Take by mouth.    . progesterone (PROMETRIUM) 100 MG capsule     . tretinoin (RETIN-A) 0.05 % cream APPLY CREAM TOPICALLY ONCE DAILY  5  . Vitamin D, Ergocalciferol, (DRISDOL) 1.25 MG (50000 UT) CAPS capsule TAKE 1 CAPSULE BY MOUTH ONCE A WEEK 12 capsule 0   No current facility-administered medications for this visit.     Family History  Problem Relation Age of Onset  . Diabetes Paternal Grandmother   . Hypertension Paternal Grandmother   . Stroke Paternal Grandfather   . Congestive Heart Failure  Maternal Grandfather   . Colon polyps Maternal Grandfather   . Colon polyps Mother   . Colon cancer Mother 36    Review of Systems  All other systems reviewed and are negative.   Exam:   BP 100/78 (BP Location: Left Arm, Patient Position: Sitting, Cuff Size: Large)   Pulse 72   Resp 14   Ht 5\' 1"  (1.549 m)   Wt 139 lb 6.4 oz (63.2 kg)   LMP 08/08/2006 (Approximate)   BMI 26.34 kg/m      Height: 5\' 1"  (154.9 cm)  Ht Readings from Last 3 Encounters:  09/21/18 5\' 1"  (1.549 m)  09/12/18 5\' 1"  (1.549 m)  08/10/18 5\' 1"  (1.549 m)    General appearance: alert, cooperative and appears stated age Head: Normocephalic, without obvious abnormality, atraumatic Neck: no adenopathy, supple, symmetrical, trachea midline and thyroid normal to inspection and palpation Lungs: clear to auscultation bilaterally Breasts:  normal appearance, no masses or tenderness , bilateral breast implants present Heart: regular rate and rhythm Abdomen: soft, non-tender; bowel sounds normal; no masses,  no organomegaly Extremities: extremities normal, atraumatic, no cyanosis or edema Skin: Skin color, texture, turgor normal. No rashes or lesions Lymph nodes: Cervical, supraclavicular, and axillary nodes normal. No abnormal inguinal nodes palpated Neurologic: Grossly normal   Pelvic: External genitalia:  no lesions              Urethra:  normal appearing urethra with no masses, tenderness or lesions              Bartholins and Skenes: normal                 Vagina: normal appearing vagina with normal color and discharge, no lesions              Cervix: no lesions              Pap taken: Yes.   Bimanual Exam:  Uterus:  normal size, contour, position, consistency, mobility, non-tender              Adnexa: normal adnexa and no mass, fullness, tenderness               Rectovaginal: Confirms               Anus:  normal sphincter tone, no lesions  Chaperone was present for exam.  A:  Well Woman with normal exam H/o endometrial ablation with no recent vaginal bleeding H/o LGSIL pap 8/08 On hormonal supplementation with integrative provider Osteopenia Family hx of colon cancer (mother aged 89 at diagnosis) H/O vaginal rejuvenation surgery with Dr. Rayetta Humphrey, 3/15 Bilateral breast implants  P:   Mammogram guidelines reviewed.   pap smear and HR HPV obtained today Ifob given.  Plan colonoscopy at age 33 CBC, CMP, Vit D levels obtained today Total testosterone obtained as well Return annually or prn

## 2018-09-24 LAB — COMPREHENSIVE METABOLIC PANEL
ALT: 14 IU/L (ref 0–32)
AST: 16 IU/L (ref 0–40)
Albumin/Globulin Ratio: 2 (ref 1.2–2.2)
Albumin: 4.5 g/dL (ref 3.8–4.8)
Alkaline Phosphatase: 74 IU/L (ref 39–117)
BUN/Creatinine Ratio: 11 (ref 9–23)
BUN: 10 mg/dL (ref 6–24)
Bilirubin Total: 0.2 mg/dL (ref 0.0–1.2)
CO2: 24 mmol/L (ref 20–29)
Calcium: 9.1 mg/dL (ref 8.7–10.2)
Chloride: 101 mmol/L (ref 96–106)
Creatinine, Ser: 0.88 mg/dL (ref 0.57–1.00)
GFR calc Af Amer: 90 mL/min/{1.73_m2} (ref >59)
GFR calc non Af Amer: 78 mL/min/{1.73_m2} (ref >59)
Globulin, Total: 2.3 g/dL (ref 1.5–4.5)
Glucose: 82 mg/dL (ref 65–99)
Potassium: 4 mmol/L (ref 3.5–5.2)
Sodium: 140 mmol/L (ref 134–144)
Total Protein: 6.8 g/dL (ref 6.0–8.5)

## 2018-09-24 LAB — CBC
Hematocrit: 38.2 % (ref 34.0–46.6)
Hemoglobin: 12.4 g/dL (ref 11.1–15.9)
MCH: 29.9 pg (ref 26.6–33.0)
MCHC: 32.5 g/dL (ref 31.5–35.7)
MCV: 92 fL (ref 79–97)
Platelets: 291 10*3/uL (ref 150–450)
RBC: 4.15 x10E6/uL (ref 3.77–5.28)
RDW: 11.8 % (ref 11.7–15.4)
WBC: 5.7 10*3/uL (ref 3.4–10.8)

## 2018-09-24 LAB — VITAMIN D 25 HYDROXY (VIT D DEFICIENCY, FRACTURES): Vit D, 25-Hydroxy: 38.5 ng/mL (ref 30.0–100.0)

## 2018-09-24 LAB — TESTOSTERONE, TOTAL, LC/MS/MS: Testosterone, total: 120.8 ng/dL

## 2018-09-25 ENCOUNTER — Telehealth: Payer: Self-pay | Admitting: *Deleted

## 2018-09-25 LAB — CYTOLOGY - PAP
Diagnosis: NEGATIVE
HPV: NOT DETECTED

## 2018-09-25 NOTE — Telephone Encounter (Signed)
-----   Message from Megan Salon, MD sent at 09/24/2018  9:11 PM EST ----- Please let pt know her CMp, CBC and Vit D levels were normal.  Can you also let her know her testosterone level was 120.  Normal range is usually about 20-55 and I will sometimes use testosterone supplementation with blood values up to about 80 but I typically don't go any higher than that for concerns about acne and female patterned hair loss.  She may want to review this value with integrative provider.  Thanks.

## 2018-09-25 NOTE — Telephone Encounter (Signed)
LM for pt to call back.

## 2018-09-27 NOTE — Telephone Encounter (Signed)
Notes recorded by Polly Cobia, CMA on 09/27/2018 at 10:56 AM EST Patient notified of message per Dr. Sabra Heck. Patient agreed and verbalized understanding. She states she is not having any symptoms.  AEX scheduled 01/31/20

## 2018-10-31 ENCOUNTER — Ambulatory Visit: Payer: 59 | Admitting: Family Medicine

## 2018-11-20 ENCOUNTER — Ambulatory Visit: Payer: 59 | Admitting: Family Medicine

## 2018-12-13 ENCOUNTER — Encounter: Payer: Self-pay | Admitting: Family Medicine

## 2018-12-13 ENCOUNTER — Ambulatory Visit: Payer: 59 | Admitting: Family Medicine

## 2018-12-13 ENCOUNTER — Telehealth: Payer: Self-pay | Admitting: Family Medicine

## 2018-12-13 ENCOUNTER — Other Ambulatory Visit: Payer: Self-pay

## 2018-12-13 VITALS — BP 100/74 | HR 85 | Ht 61.0 in | Wt 135.0 lb

## 2018-12-13 DIAGNOSIS — M999 Biomechanical lesion, unspecified: Secondary | ICD-10-CM

## 2018-12-13 DIAGNOSIS — M1612 Unilateral primary osteoarthritis, left hip: Secondary | ICD-10-CM

## 2018-12-13 MED ORDER — NITROGLYCERIN 0.2 MG/HR TD PT24: MEDICATED_PATCH | TRANSDERMAL | 1 refills | Status: DC

## 2018-12-13 MED ORDER — METAXALONE 800 MG PO TABS: 800.0000 mg | ORAL_TABLET | Freq: Three times a day (TID) | ORAL | 3 refills | Status: DC

## 2018-12-13 NOTE — Assessment & Plan Note (Addendum)
Decision today to treat with OMT was based on Physical Exam  After verbal consent patient was treated with HVLA, ME, FPR techniques in  thoracic, lumbar and sacral areas  Patient tolerated the procedure well with improvement in symptoms  Patient given exercises, stretches and lifestyle modifications  See medications in patient instructions if given  Patient will follow up in 4-8 weeks 

## 2018-12-13 NOTE — Telephone Encounter (Signed)
Copied from [facility] [id number]. Topic: Quick Communication - See Telephone Encounter >> Dec 13, 2018  2:38 PM Rutherford Nail, [place] wrote: CRM for notification. See Telephone encounter for: 12/13/18. Felicia with [facility] calling and is requesting clarification on the instructions on that nitroGLYCERIN (NITRODUR - DOSED IN MG/24 HR) 0.2 mg/hr patch prescription.  CB#: [phone]

## 2018-12-13 NOTE — Telephone Encounter (Signed)
Spoke with pharmacist  

## 2018-12-13 NOTE — Assessment & Plan Note (Signed)
Stable overall.  Patient seems to be doing well conservative therapy.  We discussed icing regimen and home exercise.  Discussed which activities to do which wants to avoid.  Patient has been slowly increase activity.  Patient will see me again in 4 to 6 weeks

## 2018-12-13 NOTE — Patient Instructions (Addendum)
Good to see you  Hip flexor very tight  Muscle relaxer as well  Sent in nitro under you acidentally but ok  See you in 4 weeks.toyed with sounds like a new date

## 2018-12-13 NOTE — Progress Notes (Signed)
[name] [address], [place] [address] Phone: [phone] Subjective:   I Andrea [name] am serving as a Education administrator for Dr. Hulan Saas.  I'm seeing this patient by the request  of:    CC: Hip pain  RAQ:[id number]  Andrea [name] is a 48 y.o. female coming in with complaint of left hip pain. Hip has good and bad days. Yesterday was a bad day. 2 Weeks ago right sided back pain. States it felt like a spasm. Couldn't touch right hip with left hand. Used Ice, heat, tens unit. She says she feels a lot better than what it was.  Patient has known arthritic changes as well as labral tear of the hip.  Patient continues to try to be active.  Having more of a right-sided low back pain noted recently.  Has responded well to manipulation of the       Past Medical History:  Diagnosis Date  . 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 Arthritis of left hip    saw Dr. [name], had MRI  . Headache   . Osteopenia    Past Surgical History:  Procedure Laterality Date  . BREAST ENHANCEMENT SURGERY  4/00  . BREAST SURGERY  6/08   revision on right breast due to tight muscles  . CESAREAN SECTION    . HTA     and BTL  . OTHER SURGICAL HISTORY  10/2013   Vaginal rejuvination surgery, Dr. [name], [place]   Social History   Socioeconomic History  . Marital status: Married    Spouse name: Not on file  . Number of children: Not on file  . Years of education: Not on file  . Highest education level: Not on file  Occupational History  . Not on file  Social Needs  . Financial resource strain: Not on file  . Food insecurity:    Worry: Not on file    Inability: Not on file  . Transportation needs:    Medical: Not on file    Non-medical: Not on file  Tobacco Use  . Smoking status: Never Smoker  . Smokeless tobacco: Never Used  Substance and Sexual Activity  . Alcohol use: Not Currently  . Drug use: No  . Sexual activity: Yes    Partners: Male   Birth control/protection: Surgical    Comment: BTL  Lifestyle  . Physical activity:    Days per week: Not on file    Minutes per session: Not on file  . Stress: Not on file  Relationships  . Social connections:    Talks on phone: Not on file    Gets together: Not on file    Attends religious service: Not on file    Active member of club or organization: Not on file    Attends meetings of clubs or organizations: Not on file    Relationship status: Not on file  Other Topics Concern  . Not on file  Social History Narrative  . Not on file   No Known Allergies Family History  Problem Relation Age of Onset  . Diabetes Paternal Grandmother   . Hypertension Paternal Grandmother   . Stroke Paternal Grandfather   . Congestive Heart Failure Maternal Grandfather   . Colon polyps Maternal Grandfather   . Colon polyps Mother   . Colon cancer Mother 26    Current Outpatient Medications (Endocrine & Metabolic):  [name]  NATURE-THROID 32.5 MG tablet, Take 32.5 mg  by mouth every morning. .  progesterone (PROMETRIUM) 100 MG capsule,   Current Outpatient Medications (Cardiovascular):  .  nitroGLYCERIN (NITRODUR - DOSED IN MG/24 HR) 0.2 mg/hr patch, 1/2 patch daily  Current Outpatient Medications (Respiratory):  .  levocetirizine (XYZAL) 5 MG tablet,   Current Outpatient Medications (Analgesics):  .  meloxicam (MOBIC) 15 MG tablet, Take 1 tablet (15 mg total) by mouth daily.   Current Outpatient Medications (Other):  [name]  B Complex Vitamins (B COMPLEX PO), Take by mouth. .  Calcium Carbonate (CALCIUM 600 PO), Take by mouth daily. .  Coenzyme Q10 (CO Q 10 PO), Take by mouth. .  gabapentin (NEURONTIN) 100 MG capsule, Take 2 capsules (200 mg total) by mouth at bedtime. .  Multiple Vitamins-Minerals (MULTIVITAMIN PO), Take by mouth daily. .  NON FORMULARY, Ashwaganda .  NONFORMULARY OR COMPOUNDED ITEM, Testosterone 2% cream .  Omega-3 Fatty Acids (FISH OIL PO), Take by mouth. .  Probiotic  Product (PROBIOTIC PO), Take by mouth. .  tretinoin (RETIN-A) 0.05 % cream, APPLY CREAM TOPICALLY ONCE DAILY .  Vitamin D, Ergocalciferol, (DRISDOL) 1.25 MG (50000 UT) CAPS capsule, TAKE 1 CAPSULE BY MOUTH ONCE A WEEK .  metaxalone (SKELAXIN) 800 MG tablet, Take 1 tablet (800 mg total) by mouth 3 (three) times daily.    Past medical history, social, surgical and family history all reviewed in electronic medical record.  No pertanent information unless stated regarding to the chief complaint.   Review of Systems:  No headache, visual changes, nausea, vomiting, diarrhea, constipation, dizziness, abdominal pain, skin rash, fevers, chills, night sweats, weight loss, swollen lymph nodes, body aches, joint swelling, chest pain, shortness of breath, mood changes.  Positive muscle aches  Objective  Blood pressure 100/74, pulse 85, height 5\' 1"  (1.549 m), weight 135 lb (61.2 kg), SpO2 98 %.    General: No apparent distress alert and oriented x3 mood and affect normal, dressed appropriately.  HEENT: Pupils equal, extraocular movements intact  Respiratory: Patient's speak in full sentences and does not appear short of breath  Cardiovascular: No lower extremity edema, non tender, no erythema  Skin: Warm dry intact with no signs of infection or rash on extremities or on axial skeleton.  Abdomen: Soft nontender  Neuro: Cranial nerves II through XII are intact, neurovascularly intact in all extremities with 2+ DTRs and 2+ pulses.  Lymph: No lymphadenopathy of posterior or anterior cervical chain or axillae bilaterally.  Gait normal with good balance and coordination.  MSK:  Non tender with full range of motion and good stability and symmetric strength and tone of shoulders, elbows, wrist,  knee and ankles bilaterally.  Left hip exam shows some mild loss of range of motion.  Tenderness over the sacroiliac joints bilaterally.  More tightness on the right radiating.  Osteopathic findings  T9 extended  rotated and side bent left L2 flexed rotated and side bent right Sacrum left on left       Impression and Recommendations:     This case required medical decision making of moderate complexity. The above documentation has been reviewed and is accurate and complete Lyndal Pulley, DO       Note: This dictation was prepared with Dragon dictation along with smaller phrase technology. Any transcriptional errors that result from this process are unintentional.

## 2019-01-09 ENCOUNTER — Encounter: Payer: Self-pay | Admitting: Family Medicine

## 2019-01-09 ENCOUNTER — Other Ambulatory Visit: Payer: Self-pay

## 2019-01-09 ENCOUNTER — Ambulatory Visit (INDEPENDENT_AMBULATORY_CARE_PROVIDER_SITE_OTHER): Payer: 59 | Admitting: Family Medicine

## 2019-01-09 VITALS — BP 100/62 | HR 75 | Ht 61.0 in | Wt 133.0 lb

## 2019-01-09 DIAGNOSIS — M545 Low back pain, unspecified: Secondary | ICD-10-CM

## 2019-01-09 DIAGNOSIS — M999 Biomechanical lesion, unspecified: Secondary | ICD-10-CM

## 2019-01-09 DIAGNOSIS — G8929 Other chronic pain: Secondary | ICD-10-CM

## 2019-01-09 NOTE — Patient Instructions (Signed)
Follow up in 4-8 weeks

## 2019-01-09 NOTE — Progress Notes (Signed)
Andrea [name], [place] [address] Phone: [phone] Subjective:   I Andrea [name] am serving as a Education administrator for Dr. Hulan [name].    CC: Low back pain and hip pain  NID:[id number] 12/13/2018: Stable overall.  Patient seems to be doing well conservative therapy.  We discussed icing regimen and home exercise.  Discussed which activities to do which wants to avoid.  Patient has been slowly increase activity.  Patient will see me again in 4 to 6 weeks  Update 01/09/2019: Andrea [name] is a 48 y.o. female coming in with complaint of back and left hip pain. Last seen on 12/13/2018. Patient states she is feeling much better than she did last visit.  Patient was having significant tightness last time.  Is making significant improvement.   Past Medical History:  Diagnosis Date  . 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 Arthritis of left hip    saw Dr. Lynann [name], had MRI  . Headache   . Osteopenia    Past Surgical History:  Procedure Laterality Date  . BREAST ENHANCEMENT SURGERY  4/00  . BREAST SURGERY  6/08   revision on right breast due to tight muscles  . CESAREAN SECTION    . HTA     and BTL  . OTHER SURGICAL HISTORY  10/2013   Vaginal rejuvination surgery, Dr. Kathreen [name], [place]   Social History   Socioeconomic History  . Marital status: Married    Spouse name: Not on file  . Number of children: Not on file  . Years of education: Not on file  . Highest education level: Not on file  Occupational History  . Not on file  Social Needs  . Financial resource strain: Not on file  . Food insecurity:    Worry: Not on file    Inability: Not on file  . Transportation needs:    Medical: Not on file    Non-medical: Not on file  Tobacco Use  . Smoking status: Never Smoker  . Smokeless tobacco: Never Used  Substance and Sexual Activity  . Alcohol use: Not Currently  . Drug use: No  . Sexual activity: Yes    Partners: Male    Birth  control/protection: Surgical    Comment: BTL  Lifestyle  . Physical activity:    Days per week: Not on file    Minutes per session: Not on file  . Stress: Not on file  Relationships  . Social connections:    Talks on phone: Not on file    Gets together: Not on file    Attends religious service: Not on file    Active member of club or organization: Not on file    Attends meetings of clubs or organizations: Not on file    Relationship status: Not on file  Other Topics Concern  . Not on file  Social History Narrative  . Not on file   No Known Allergies Family History  Problem Relation Age of Onset  . Diabetes Paternal Grandmother   . Hypertension Paternal Grandmother   . Stroke Paternal Grandfather   . Congestive Heart Failure Maternal Grandfather   . Colon polyps Maternal Grandfather   . Colon polyps Mother   . Colon cancer Mother 29    Current Outpatient Medications (Endocrine & Metabolic):  [name]  NATURE-THROID 32.5 MG tablet, Take 32.5 mg by mouth every morning. .  progesterone (PROMETRIUM) 100 MG capsule,   Current Outpatient  Medications (Cardiovascular):  .  nitroGLYCERIN (NITRODUR - DOSED IN MG/24 HR) 0.2 mg/hr patch, 1/2 patch daily  Current Outpatient Medications (Respiratory):  .  levocetirizine (XYZAL) 5 MG tablet,   Current Outpatient Medications (Analgesics):  .  meloxicam (MOBIC) 15 MG tablet, Take 1 tablet (15 mg total) by mouth daily.   Current Outpatient Medications (Other):  [name]  B Complex Vitamins (B COMPLEX PO), Take by mouth. .  Calcium Carbonate (CALCIUM 600 PO), Take by mouth daily. .  Coenzyme Q10 (CO Q 10 PO), Take by mouth. .  gabapentin (NEURONTIN) 100 MG capsule, Take 2 capsules (200 mg total) by mouth at bedtime. .  metaxalone (SKELAXIN) 800 MG tablet, Take 1 tablet (800 mg total) by mouth 3 (three) times daily. .  Multiple Vitamins-Minerals (MULTIVITAMIN PO), Take by mouth daily. .  NON FORMULARY, Ashwaganda .  NONFORMULARY OR COMPOUNDED  ITEM, Testosterone 2% cream .  Omega-3 Fatty Acids (FISH OIL PO), Take by mouth. .  Probiotic Product (PROBIOTIC PO), Take by mouth. .  tretinoin (RETIN-A) 0.05 % cream, APPLY CREAM TOPICALLY ONCE DAILY .  Vitamin D, Ergocalciferol, (DRISDOL) 1.25 MG (50000 UT) CAPS capsule, TAKE 1 CAPSULE BY MOUTH ONCE A WEEK    Past medical history, social, surgical and family history all reviewed in electronic medical record.  No pertanent information unless stated regarding to the chief complaint.   Review of Systems:  No headache, visual changes, nausea, vomiting, diarrhea, constipation, dizziness, abdominal pain, skin rash, fevers, chills, night sweats, weight loss, swollen lymph nodes, body aches, joint swelling,  chest pain, shortness of breath, mood changes.  Positive muscle aches  Objective  Blood pressure 100/62, pulse 75, height 5\' 1"  (1.549 m), weight 133 lb (60.3 kg), SpO2 94 %.   General: No apparent distress alert and oriented x3 mood and affect normal, dressed appropriately.  HEENT: Pupils equal, extraocular movements intact  Respiratory: Patient's speak in full sentences and does not appear short of breath  Cardiovascular: No lower extremity edema, non tender, no erythema  Skin: Warm dry intact with no signs of infection or rash on extremities or on axial skeleton.  Abdomen: Soft nontender  Neuro: Cranial nerves II through XII are intact, neurovascularly intact in all extremities with 2+ DTRs and 2+ pulses.  Lymph: No lymphadenopathy of posterior or anterior cervical chain or axillae bilaterally.  Gait normal with good balance and coordination.  MSK:  Non tender with full range of motion and good stability and symmetric strength and tone of shoulders, elbows, wrist, hip, knee and ankles bilaterally.  Back exam shows mild tightness.  Hip flexors are very tight especially left greater than right.  Mild positive Andrea [name].  Mild decrease in range of motion of the left hip compared to the right  hip.  Good strength though.  No radicular symptoms.  Osteopathic findings   T9 extended rotated and side bent left L2 flexed rotated and side bent right Sacrum left on left    Impression and Recommendations:     This case required medical decision making of moderate complexity. The above documentation has been reviewed and is accurate and complete Lyndal Pulley, DO       Note: This dictation was prepared with Dragon dictation along with smaller phrase technology. Any transcriptional errors that result from this process are unintentional.

## 2019-01-09 NOTE — Assessment & Plan Note (Signed)
Still believe low back pain is secondary to tightness of the hip flexors.  Does have the underlying hip arthritis as well.  Patient is to increase activity slowly over the course the next several weeks.  Discussed which activities of doing which wants to avoid.  We discussed posture and ergonomics.  Follow-up again 4 to 8 weeks

## 2019-01-09 NOTE — Assessment & Plan Note (Addendum)
Decision today to treat with OMT was based on Physical Exam  After verbal consent patient was treated with HVLA, ME, FPR techniques in  thoracic, lumbar and sacral areas  Patient tolerated the procedure well with improvement in symptoms  Patient given exercises, stretches and lifestyle modifications  See medications in patient instructions if given  Patient will follow up in 4-8 weeks 

## 2019-01-12 ENCOUNTER — Other Ambulatory Visit: Payer: Self-pay | Admitting: Family Medicine

## 2019-02-06 ENCOUNTER — Ambulatory Visit: Payer: 59 | Admitting: Family Medicine

## 2019-02-25 ENCOUNTER — Encounter: Payer: Self-pay | Admitting: Family Medicine

## 2019-02-25 ENCOUNTER — Other Ambulatory Visit: Payer: Self-pay

## 2019-02-25 ENCOUNTER — Ambulatory Visit: Payer: 59 | Admitting: Family Medicine

## 2019-02-25 VITALS — BP 118/64 | HR 86 | Ht 61.0 in | Wt 134.0 lb

## 2019-02-25 DIAGNOSIS — M999 Biomechanical lesion, unspecified: Secondary | ICD-10-CM | POA: Diagnosis not present

## 2019-02-25 DIAGNOSIS — M1612 Unilateral primary osteoarthritis, left hip: Secondary | ICD-10-CM | POA: Diagnosis not present

## 2019-02-25 MED ORDER — MELOXICAM 7.5 MG PO TABS: 7.5000 mg | ORAL_TABLET | Freq: Every day | ORAL | 0 refills | Status: DC

## 2019-02-25 NOTE — Assessment & Plan Note (Signed)
Patient is more arthritic changes.  Patient could be a candidate for possible PRP.  Patient will consider.  Discussed which activities to do which wants to avoid.  Patient will follow-up with me again in 4 to 8 weeks

## 2019-02-25 NOTE — Progress Notes (Signed)
Andrea [name] [address] [place], [place] [address] Phone: [phone] Subjective:   Andrea [name], am serving as a scribe for Dr. Hulan [name].    CC: Hip pain and back pain follow-up  [id number]  Andrea [name] is a 48 y.o. female coming in with complaint of left hip/back pain. Last seen on 01/09/2019. Patient states that left hip stiffness has increased over this past month. Using 7.5mg  of Mobic daily over past month. Is curious if the weather or hormones could be causing stiffness. Continues to be active and does not have pain with activity.       Past Medical History:  Diagnosis Date  . 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 Arthritis of left hip    saw Dr. Lynann [name], had MRI  . Headache   . Osteopenia    Past Surgical History:  Procedure Laterality Date  . BREAST ENHANCEMENT SURGERY  4/00  . BREAST SURGERY  6/08   revision on right breast due to tight muscles  . CESAREAN SECTION    . HTA     and BTL  . OTHER SURGICAL HISTORY  10/2013   Vaginal rejuvination surgery, Dr. Kathreen [name], [place]   Social History   Socioeconomic History  . Marital status: Married    Spouse name: Not on file  . Number of children: Not on file  . Years of education: Not on file  . Highest education level: Not on file  Occupational History  . Not on file  Social Needs  . Financial resource strain: Not on file  . Food insecurity    Worry: Not on file    Inability: Not on file  . Transportation needs    Medical: Not on file    Non-medical: Not on file  Tobacco Use  . Smoking status: Never Smoker  . Smokeless tobacco: Never Used  Substance and Sexual Activity  . Alcohol use: Not Currently  . Drug use: [name]  . Sexual activity: Yes    Partners: Male    Birth control/protection: Surgical    Comment: BTL  Lifestyle  . Physical activity    Days per week: Not on file    Minutes per session: Not on file  . Stress: Not on file  Relationships  .  Social Herbalist on phone: Not on file    Gets together: Not on file    Attends religious service: Not on file    Active member of club or organization: Not on file    Attends meetings of clubs or organizations: Not on file    Relationship status: Not on file  Other Topics Concern  . Not on file  Social History Narrative  . Not on file   [name] Known Allergies Family History  Problem Relation Age of Onset  . Diabetes Paternal Grandmother   . Hypertension Paternal Grandmother   . Stroke Paternal Grandfather   . Congestive Heart Failure Maternal Grandfather   . Colon polyps Maternal Grandfather   . Colon polyps Mother   . Colon cancer Mother 70    Current Outpatient Medications (Endocrine & Metabolic):  [name]  NATURE-THROID 32.5 MG tablet, Take 32.5 mg by mouth every morning. .  progesterone (PROMETRIUM) 100 MG capsule,   Current Outpatient Medications (Cardiovascular):  .  nitroGLYCERIN (NITRODUR - DOSED IN MG/24 HR) 0.2 mg/hr patch, 1/2 patch daily  Current Outpatient Medications (Respiratory):  .  levocetirizine (XYZAL) 5 MG tablet,  Current Outpatient Medications (Analgesics):  .  meloxicam (MOBIC) 15 MG tablet, Take 1 tablet (15 mg total) by mouth daily. .  meloxicam (MOBIC) 7.5 MG tablet, Take 1 tablet (7.5 mg total) by mouth daily.   Current Outpatient Medications (Other):  [name]  B Complex Vitamins (B COMPLEX PO), Take by mouth. .  Calcium Carbonate (CALCIUM 600 PO), Take by mouth daily. .  Coenzyme Q10 (CO Q 10 PO), Take by mouth. .  gabapentin (NEURONTIN) 100 MG capsule, Take 2 capsules (200 mg total) by mouth at bedtime. .  metaxalone (SKELAXIN) 800 MG tablet, Take 1 tablet (800 mg total) by mouth 3 (three) times daily. .  Multiple Vitamins-Minerals (MULTIVITAMIN PO), Take by mouth daily. .  NON FORMULARY, Ashwaganda .  NONFORMULARY OR COMPOUNDED ITEM, Testosterone 2% cream .  Omega-3 Fatty Acids (FISH OIL PO), Take by mouth. .  Probiotic Product (PROBIOTIC  PO), Take by mouth. .  tretinoin (RETIN-A) 0.05 % cream, APPLY CREAM TOPICALLY ONCE DAILY .  Vitamin D, Ergocalciferol, (DRISDOL) 1.25 MG (50000 UT) CAPS capsule, Take 1 capsule by mouth once a week    Past medical history, social, surgical and family history all reviewed in electronic medical record.  [name] pertanent information unless stated regarding to the chief complaint.   Review of Systems:  [name], visual changes, nausea, vomiting, diarrhea, constipation, dizziness, abdominal pain, skin rash, fevers, chills, night sweats, weight loss, swollen lymph nodes, body aches, joint swelling, chest pain, shortness of breath, mood changes.   Objective  Blood pressure 118/64, pulse 86, height 5\' 1"  (1.549 m), weight 134 lb (60.8 kg), SpO2 98 %.    General: [name] apparent distress alert and oriented x3 mood and affect normal, dressed appropriately.  HEENT: Pupils equal, extraocular movements intact  Respiratory: Patient's speak in full sentences and does not appear short of breath  Cardiovascular: [name] lower extremity edema, non tender, [name] erythema  Skin: Warm dry intact with [name] signs of infection or rash on extremities or on axial skeleton.  Abdomen: Soft nontender  Neuro: Cranial nerves II through XII are intact, neurovascularly intact in all extremities with 2+ DTRs and 2+ pulses.  Lymph: [name] lymphadenopathy of posterior or anterior cervical chain or axillae bilaterally.  Gait antalgic MSK:  tender with full range of motion and good stability and symmetric strength and tone of shoulders, elbows, wrist, , knee and ankles bilaterally.   Patient's hip shows on the left side to decrease in internal range of motion of 5 to 10 degrees.  Patient does have a positive grind test.  Negative straight leg test.  Tightness of the paraspinal musculature of the lumbar spine left greater than right   Osteopathic findings  C6 flexed rotated and side bent left T9 extended rotated and side bent left L2 flexed  rotated and side bent right Sacrum right on right    Impression and Recommendations:     This case required medical decision making of moderate complexity. The above documentation has been reviewed and is accurate and complete Lyndal Pulley, DO       Note: This dictation was prepared with Dragon dictation along with smaller phrase technology. Any transcriptional errors that result from this process are unintentional.

## 2019-02-25 NOTE — Patient Instructions (Signed)
Meloxicam 7.5mg  DHEA 50mg  4 weeks See me back in 6 weeks

## 2019-02-25 NOTE — Assessment & Plan Note (Signed)
Decision today to treat with OMT was based on Physical Exam  After verbal consent patient was treated with HVLA, ME, FPR techniques in  thoracic, lumbar and sacral areas  Patient tolerated the procedure well with improvement in symptoms  Patient given exercises, stretches and lifestyle modifications  See medications in patient instructions if given  Patient will follow up in 4-8 weeks 

## 2019-03-09 HISTORY — PX: NASAL SEPTOPLASTY W/ TURBINOPLASTY: SHX2070

## 2019-04-05 ENCOUNTER — Ambulatory Visit: Payer: 59 | Admitting: Family Medicine

## 2019-04-05 ENCOUNTER — Encounter: Payer: Self-pay | Admitting: Family Medicine

## 2019-04-05 ENCOUNTER — Other Ambulatory Visit: Payer: Self-pay

## 2019-04-05 VITALS — BP 110/72 | HR 75 | Ht 61.0 in | Wt 135.0 lb

## 2019-04-05 DIAGNOSIS — M999 Biomechanical lesion, unspecified: Secondary | ICD-10-CM | POA: Diagnosis not present

## 2019-04-05 DIAGNOSIS — G8929 Other chronic pain: Secondary | ICD-10-CM

## 2019-04-05 DIAGNOSIS — M545 Low back pain: Secondary | ICD-10-CM | POA: Diagnosis not present

## 2019-04-05 NOTE — Progress Notes (Signed)
[name] [address] [place], [place] [address] Phone: [phone] Subjective:   [name], am serving as a scribe for Dr. Hulan Saas.   CC: low back pain follow up   [id number]   Andrea [name] is a 48 y.o. female coming in with complaint of back pain. Last seen on 02/25/2019 for OMT. Patient states that the popping in her hip is deeper than usual. Is not painful when she gets the popping. Otherwise is feeling the same as last visit.     Past Medical History:  Diagnosis Date  . 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 Arthritis of left hip    saw Dr. [name], had MRI  . Headache   . Osteopenia    Past Surgical History:  Procedure Laterality Date  . BREAST ENHANCEMENT SURGERY  4/00  . BREAST SURGERY  6/08   revision on right breast due to tight muscles  . CESAREAN SECTION    . HTA     and BTL  . OTHER SURGICAL HISTORY  10/2013   Vaginal rejuvination surgery, Dr. [name], [place]   Social History   Socioeconomic History  . Marital status: Married    Spouse name: Not on file  . Number of children: Not on file  . Years of education: Not on file  . Highest education level: Not on file  Occupational History  . Not on file  Social Needs  . Financial resource strain: Not on file  . Food insecurity    Worry: Not on file    Inability: Not on file  . Transportation needs    Medical: Not on file    Non-medical: Not on file  Tobacco Use  . Smoking status: Never Smoker  . Smokeless tobacco: Never Used  Substance and Sexual Activity  . Alcohol use: Not Currently  . Drug use: No  . Sexual activity: Yes    Partners: Male    Birth control/protection: Surgical    Comment: BTL  Lifestyle  . Physical activity    Days per week: Not on file    Minutes per session: Not on file  . Stress: Not on file  Relationships  . Social Herbalist on phone: Not on file    Gets together: Not on file    Attends religious  service: Not on file    Active member of club or organization: Not on file    Attends meetings of clubs or organizations: Not on file    Relationship status: Not on file  Other Topics Concern  . Not on file  Social History Narrative  . Not on file   No Known Allergies Family History  Problem Relation Age of Onset  . Diabetes Paternal Grandmother   . Hypertension Paternal Grandmother   . Stroke Paternal Grandfather   . Congestive Heart Failure Maternal Grandfather   . Colon polyps Maternal Grandfather   . Colon polyps Mother   . Colon cancer Mother 41    Current Outpatient Medications (Endocrine & Metabolic):  [name]  NATURE-THROID 32.5 MG tablet, Take 32.5 mg by mouth every morning. .  progesterone (PROMETRIUM) 100 MG capsule,   Current Outpatient Medications (Cardiovascular):  .  nitroGLYCERIN (NITRODUR - DOSED IN MG/24 HR) 0.2 mg/hr patch, 1/2 patch daily  Current Outpatient Medications (Respiratory):  .  levocetirizine (XYZAL) 5 MG tablet,   Current Outpatient Medications (Analgesics):  .  meloxicam (MOBIC) 15 MG tablet, Take  1 tablet (15 mg total) by mouth daily. .  meloxicam (MOBIC) 7.5 MG tablet, Take 1 tablet (7.5 mg total) by mouth daily.   Current Outpatient Medications (Other):  [name]  B Complex Vitamins (B COMPLEX PO), Take by mouth. .  Calcium Carbonate (CALCIUM 600 PO), Take by mouth daily. .  Coenzyme Q10 (CO Q 10 PO), Take by mouth. .  gabapentin (NEURONTIN) 100 MG capsule, Take 2 capsules (200 mg total) by mouth at bedtime. .  metaxalone (SKELAXIN) 800 MG tablet, Take 1 tablet (800 mg total) by mouth 3 (three) times daily. .  Multiple Vitamins-Minerals (MULTIVITAMIN PO), Take by mouth daily. .  NON FORMULARY, Ashwaganda .  NONFORMULARY OR COMPOUNDED ITEM, Testosterone 2% cream .  Omega-3 Fatty Acids (FISH OIL PO), Take by mouth. .  Probiotic Product (PROBIOTIC PO), Take by mouth. .  tretinoin (RETIN-A) 0.05 % cream, APPLY CREAM TOPICALLY ONCE DAILY .  Vitamin D,  Ergocalciferol, (DRISDOL) 1.25 MG (50000 UT) CAPS capsule, Take 1 capsule by mouth once a week    Past medical history, social, surgical and family history all reviewed in electronic medical record.  No pertanent information unless stated regarding to the chief complaint.   Review of Systems:  No headache, visual changes, nausea, vomiting, diarrhea, constipation, dizziness, abdominal pain, skin rash, fevers, chills, night sweats, weight loss, swollen lymph nodes, body aches, joint swelling, emergency this morning and I did ask for the emergency list because of the way she put happy to do something at home.anastrozole chest pain, shortness of breath, mood changes.  Positive muscle aches  Objective  Blood pressure 110/72, pulse 75, height 5\' 1"  (1.549 m), weight 135 lb (61.2 kg), SpO2 98 %.    General: No apparent distress alert and oriented x3 mood and affect normal, dressed appropriately.  HEENT: Pupils equal, extraocular movements intact  Respiratory: Patient's speak in full sentences and does not appear short of breath  Cardiovascular: No lower extremity edema, non tender, no erythema  Skin: Warm dry intact with no signs of infection or rash on extremities or on axial skeleton.  Abdomen: Soft nontender  Neuro: Cranial nerves II through XII are intact, neurovascularly intact in all extremities with 2+ DTRs and 2+ pulses.  Lymph: No lymphadenopathy of posterior or anterior cervical chain or axillae bilaterally.  Gait normal with good balance and coordination.  MSK:  Non tender with full range of motion and good stability and symmetric strength and tone of shoulders, elbows, wrist, knee and ankles bilaterally.  Back exam shows some loss of lordosis, tightness patient does have some mild limitation in range of motion of the hip on the left side more than the right.  Negative straight leg test  Osteopathic findings  T5 extended rotated and side bent left L2 flexed rotated and side bent right  Sacrum left on the left        Impression and Recommendations:     This case required medical decision making of moderate complexity. The above documentation has been reviewed and is accurate and complete Andrea Pulley, DO       Note: This dictation was prepared with Dragon dictation along with smaller phrase technology. Any transcriptional errors that result from this process are unintentional.

## 2019-04-05 NOTE — Assessment & Plan Note (Signed)
Low back pain.  Discussed posture and ergonomics, patient is doing relatively well overall.  Does have known arthritic changes of multiple left hip.  We will continue to monitor.  Follow-up with me again in 4 to 8 weeks.

## 2019-04-05 NOTE — Assessment & Plan Note (Signed)
Decision today to treat with OMT was based on Physical Exam  After verbal consent patient was treated with HVLA, ME, FPR techniques in  thoracic, lumbar and sacral areas  Patient tolerated the procedure well with improvement in symptoms  Patient given exercises, stretches and lifestyle modifications  See medications in patient instructions if given  Patient will follow up in 4 weeks 

## 2019-04-05 NOTE — Patient Instructions (Signed)
See me again in 6 weeks 

## 2019-04-08 ENCOUNTER — Ambulatory Visit: Payer: 59 | Admitting: Family Medicine

## 2019-05-16 NOTE — Progress Notes (Signed)
[name] [place], [place] [address] Phone: [phone] Subjective:   I Andrea [name] am serving as a Education administrator for Dr. Hulan Saas.    CC: Low back and hip pain follow-up  [id number]  Andrea [name] is a 48 y.o. female coming in with complaint of hip and back pain. Last seen on 04/05/2019 for OMT. Patient states she feels the same. Good and bad day. No new pain.  Patient has responded fairly well to manipulation. Patient though is wanting to consider PRP for her hip arthritis in the near future.    Past Medical History:  Diagnosis Date  . 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 Arthritis of left hip    saw Dr. [name], had MRI  . Headache   . Osteopenia    Past Surgical History:  Procedure Laterality Date  . BREAST ENHANCEMENT SURGERY  4/00  . BREAST SURGERY  6/08   revision on right breast due to tight muscles  . CESAREAN SECTION    . HTA     and BTL  . OTHER SURGICAL HISTORY  10/2013   Vaginal rejuvination surgery, Dr. [name], [place]   Social History   Socioeconomic History  . Marital status: Married    Spouse name: Not on file  . Number of children: Not on file  . Years of education: Not on file  . Highest education level: Not on file  Occupational History  . Not on file  Social Needs  . Financial resource strain: Not on file  . Food insecurity    Worry: Not on file    Inability: Not on file  . Transportation needs    Medical: Not on file    Non-medical: Not on file  Tobacco Use  . Smoking status: Never Smoker  . Smokeless tobacco: Never Used  Substance and Sexual Activity  . Alcohol use: Not Currently  . Drug use: No  . Sexual activity: Yes    Partners: Male    Birth control/protection: Surgical    Comment: BTL  Lifestyle  . Physical activity    Days per week: Not on file    Minutes per session: Not on file  . Stress: Not on file  Relationships  . Social Herbalist on phone: Not  on file    Gets together: Not on file    Attends religious service: Not on file    Active member of club or organization: Not on file    Attends meetings of clubs or organizations: Not on file    Relationship status: Not on file  Other Topics Concern  . Not on file  Social History Narrative  . Not on file   No Known Allergies Family History  Problem Relation Age of Onset  . Diabetes Paternal Grandmother   . Hypertension Paternal Grandmother   . Stroke Paternal Grandfather   . Congestive Heart Failure Maternal Grandfather   . Colon polyps Maternal Grandfather   . Colon polyps Mother   . Colon cancer Mother 1    Current Outpatient Medications (Endocrine & Metabolic):  [name]  NATURE-THROID 32.5 MG tablet, Take 32.5 mg by mouth every morning. .  progesterone (PROMETRIUM) 100 MG capsule,   Current Outpatient Medications (Cardiovascular):  .  nitroGLYCERIN (NITRODUR - DOSED IN MG/24 HR) 0.2 mg/hr patch, 1/2 patch daily  Current Outpatient Medications (Respiratory):  .  levocetirizine (XYZAL) 5 MG tablet,   Current Outpatient Medications (Analgesics):  .  meloxicam (MOBIC) 15 MG tablet, Take 1 tablet (15 mg total) by mouth daily. .  meloxicam (MOBIC) 7.5 MG tablet, Take 1 tablet (7.5 mg total) by mouth daily.   Current Outpatient Medications (Other):  [name]  B Complex Vitamins (B COMPLEX PO), Take by mouth. .  Calcium Carbonate (CALCIUM 600 PO), Take by mouth daily. .  Coenzyme Q10 (CO Q 10 PO), Take by mouth. .  gabapentin (NEURONTIN) 100 MG capsule, Take 2 capsules (200 mg total) by mouth at bedtime. .  metaxalone (SKELAXIN) 800 MG tablet, Take 1 tablet (800 mg total) by mouth 3 (three) times daily. .  Multiple Vitamins-Minerals (MULTIVITAMIN PO), Take by mouth daily. .  NON FORMULARY, Ashwaganda .  NONFORMULARY OR COMPOUNDED ITEM, Testosterone 2% cream .  Omega-3 Fatty Acids (FISH OIL PO), Take by mouth. .  Probiotic Product (PROBIOTIC PO), Take by mouth. .  tretinoin (RETIN-A)  0.05 % cream, APPLY CREAM TOPICALLY ONCE DAILY .  Vitamin D, Ergocalciferol, (DRISDOL) 1.25 MG (50000 UT) CAPS capsule, Take 1 capsule by mouth once a week    Past medical history, social, surgical and family history all reviewed in electronic medical record.  No pertanent information unless stated regarding to the chief complaint.   Review of Systems:  No headache, visual changes, nausea, vomiting, diarrhea, constipation, dizziness, abdominal pain, skin rash, fevers, chills, night sweats, weight loss, swollen lymph nodes, body aches, joint swelling, , chest pain, shortness of breath, mood changes.  Positive muscle aches  Objective  Blood pressure 104/62, pulse 84, height 5\' 1"  (1.549 m), weight 140 lb (63.5 kg), SpO2 99 %.    General: No apparent distress alert and oriented x3 mood and affect normal, dressed appropriately.  HEENT: Pupils equal, extraocular movements intact  Respiratory: Patient's speak in full sentences and does not appear short of breath  Cardiovascular: Trace lower extremity edema, non tender, no erythema  Skin: Warm dry intact with no signs of infection or rash on extremities or on axial skeleton.  Abdomen: Soft nontender  Neuro: Cranial nerves II through XII are intact, neurovascularly intact in all extremities with 2+ DTRs and 2+ pulses.  Lymph: No lymphadenopathy of posterior or anterior cervical chain or axillae bilaterally.  Gait normal with good balance and coordination.  MSK:  Non tender with full range of motion and good stability and symmetric strength and tone of shoulders, elbows, wrist, knee and ankles bilaterally.  Left hip does have some decreased range of motion with some tightness noted in the piriformis.  5 out of 5 strength. Low back pain mild loss of lordosis, tightness noted in the paraspinal musculature left greater than right.  Osteopathic findings  T3 extended rotated and side bent right inhaled third rib T7 extended rotated and side bent left  L2 flexed rotated and side bent right Sacrum left on left     Impression and Recommendations:     This case required medical decision making of moderate complexity. The above documentation has been reviewed and is accurate and complete Lyndal Pulley, DO       Note: This dictation was prepared with Dragon dictation along with smaller phrase technology. Any transcriptional errors that result from this process are unintentional.

## 2019-05-17 ENCOUNTER — Ambulatory Visit: Payer: 59 | Admitting: Family Medicine

## 2019-05-17 ENCOUNTER — Encounter: Payer: Self-pay | Admitting: Family Medicine

## 2019-05-17 ENCOUNTER — Other Ambulatory Visit: Payer: Self-pay

## 2019-05-17 VITALS — BP 104/62 | HR 84 | Ht 61.0 in | Wt 140.0 lb

## 2019-05-17 DIAGNOSIS — M1612 Unilateral primary osteoarthritis, left hip: Secondary | ICD-10-CM | POA: Diagnosis not present

## 2019-05-17 DIAGNOSIS — M545 Low back pain, unspecified: Secondary | ICD-10-CM

## 2019-05-17 DIAGNOSIS — M999 Biomechanical lesion, unspecified: Secondary | ICD-10-CM | POA: Diagnosis not present

## 2019-05-17 DIAGNOSIS — G8929 Other chronic pain: Secondary | ICD-10-CM | POA: Diagnosis not present

## 2019-05-17 NOTE — Patient Instructions (Signed)
Follow up for manipulation in 6 weeks

## 2019-05-17 NOTE — Assessment & Plan Note (Signed)
Decision today to treat with OMT was based on Physical Exam  After verbal consent patient was treated with HVLA, ME, FPR techniques in cervical, thoracic, rib,  lumbar and sacral areas  Patient tolerated the procedure well with improvement in symptoms  Patient given exercises, stretches and lifestyle modifications  See medications in patient instructions if given  Patient will follow up in 4-8 weeks 

## 2019-05-17 NOTE — Assessment & Plan Note (Signed)
Patient will continue consider the possibility of PRP.

## 2019-05-17 NOTE — Assessment & Plan Note (Signed)
Stable.  Patient does have arthritis of the left hip and will consider PRP in the near future.  Patient is responding fairly well to osteopathic manipulation, discussed icing regimen and home exercises, follow-up again in 4 to 8 weeks

## 2019-05-18 ENCOUNTER — Encounter: Payer: Self-pay | Admitting: Family Medicine

## 2019-05-29 ENCOUNTER — Other Ambulatory Visit: Payer: Self-pay | Admitting: Family Medicine

## 2019-05-30 ENCOUNTER — Ambulatory Visit (INDEPENDENT_AMBULATORY_CARE_PROVIDER_SITE_OTHER): Payer: Self-pay | Admitting: Family Medicine

## 2019-05-30 ENCOUNTER — Encounter: Payer: Self-pay | Admitting: Family Medicine

## 2019-05-30 ENCOUNTER — Other Ambulatory Visit: Payer: Self-pay

## 2019-05-30 ENCOUNTER — Ambulatory Visit: Payer: Self-pay

## 2019-05-30 VITALS — Ht 61.0 in | Wt 140.0 lb

## 2019-05-30 DIAGNOSIS — M25552 Pain in left hip: Secondary | ICD-10-CM

## 2019-05-30 DIAGNOSIS — M1612 Unilateral primary osteoarthritis, left hip: Secondary | ICD-10-CM

## 2019-05-30 NOTE — Patient Instructions (Signed)
No ice or IBU for 3 days See me again in 3 weeks 

## 2019-05-30 NOTE — Progress Notes (Signed)
[name] [place] [place], [place] [address] Phone: [phone] Subjective:   Andrea [name], am serving as a scribe for Dr. Hulan Saas.    CC: L hip pain  [id number]    I, , LAT, ATC, am serving as scribe for Dr. Hulan Saas.  05/17/19: Low back and L hip Stable.  Patient does have arthritis of the left hip and will consider PRP in the near future.  Patient is responding fairly well to osteopathic manipulation, discussed icing regimen and home exercises, follow-up again in 4 to 8 weeks  Update- 05/30/19 Andrea [name] is a 48 y.o. female coming in with complaint of L hip pain.  She is here today for PRP into her L hip.      Past Medical History:  Diagnosis Date  . 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 Arthritis of left hip    saw Dr. [name], had MRI  . Headache   . Osteopenia    Past Surgical History:  Procedure Laterality Date  . BREAST ENHANCEMENT SURGERY  4/00  . BREAST SURGERY  6/08   revision on right breast due to tight muscles  . CESAREAN SECTION    . HTA     and BTL  . OTHER SURGICAL HISTORY  10/2013   Vaginal rejuvination surgery, Dr. [name], [place]   Social History   Socioeconomic History  . Marital status: Married    Spouse name: Not on file  . Number of children: Not on file  . Years of education: Not on file  . Highest education level: Not on file  Occupational History  . Not on file  Social Needs  . Financial resource strain: Not on file  . Food insecurity    Worry: Not on file    Inability: Not on file  . Transportation needs    Medical: Not on file    Non-medical: Not on file  Tobacco Use  . Smoking status: Never Smoker  . Smokeless tobacco: Never Used  Substance and Sexual Activity  . Alcohol use: Not Currently  . Drug use: [name]  . Sexual activity: Yes    Partners: Male    Birth control/protection: Surgical    Comment: BTL  Lifestyle  . Physical activity    Days per week: Not  on file    Minutes per session: Not on file  . Stress: Not on file  Relationships  . Social Herbalist on phone: Not on file    Gets together: Not on file    Attends religious service: Not on file    Active member of club or organization: Not on file    Attends meetings of clubs or organizations: Not on file    Relationship status: Not on file  Other Topics Concern  . Not on file  Social History Narrative  . Not on file   [name] Known Allergies Family History  Problem Relation Age of Onset  . Diabetes Paternal Grandmother   . Hypertension Paternal Grandmother   . Stroke Paternal Grandfather   . Congestive Heart Failure Maternal Grandfather   . Colon polyps Maternal Grandfather   . Colon polyps Mother   . Colon cancer Mother 37    Current Outpatient Medications (Endocrine & Metabolic):  [name]  NATURE-THROID 32.5 MG tablet, Take 32.5 mg by mouth every morning. .  progesterone (PROMETRIUM) 100 MG capsule,   Current Outpatient Medications (Cardiovascular):  .  nitroGLYCERIN (  NITRODUR - DOSED IN MG/24 HR) 0.2 mg/hr patch, 1/2 patch daily  Current Outpatient Medications (Respiratory):  .  levocetirizine (XYZAL) 5 MG tablet,   Current Outpatient Medications (Analgesics):  .  meloxicam (MOBIC) 15 MG tablet, Take 1 tablet (15 mg total) by mouth daily. .  meloxicam (MOBIC) 7.5 MG tablet, Take 1 tablet (7.5 mg total) by mouth daily.   Current Outpatient Medications (Other):  [name]  B Complex Vitamins (B COMPLEX PO), Take by mouth. .  Calcium Carbonate (CALCIUM 600 PO), Take by mouth daily. .  Coenzyme Q10 (CO Q 10 PO), Take by mouth. .  gabapentin (NEURONTIN) 100 MG capsule, Take 2 capsules (200 mg total) by mouth at bedtime. .  metaxalone (SKELAXIN) 800 MG tablet, Take 1 tablet (800 mg total) by mouth 3 (three) times daily. .  Multiple Vitamins-Minerals (MULTIVITAMIN PO), Take by mouth daily. .  NON FORMULARY, Ashwaganda .  NONFORMULARY OR COMPOUNDED ITEM, Testosterone 2% cream  .  Omega-3 Fatty Acids (FISH OIL PO), Take by mouth. .  Probiotic Product (PROBIOTIC PO), Take by mouth. .  tretinoin (RETIN-A) 0.05 % cream, APPLY CREAM TOPICALLY ONCE DAILY .  Vitamin D, Ergocalciferol, (DRISDOL) 1.25 MG (50000 UT) CAPS capsule, Take 1 capsule by mouth once a week    Past medical history, social, surgical and family history all reviewed in electronic medical record.  [name] pertanent information unless stated regarding to the chief complaint.   Review of Systems:  [name], visual changes, nausea, vomiting, diarrhea, constipation, dizziness, abdominal pain, skin rash, fevers, chills, night sweats, weight loss, swollen lymph nodes, body aches, joint swelling, muscle aches, chest pain, shortness of breath, mood changes.   Objective  Height 5\' 1"  (1.549 m), weight 140 lb (63.5 kg).    General: [name] apparent distress alert and oriented x3 mood and affect normal, dressed appropriately.   Gait normal with good balance and coordination.  MSK:  Non tender with full range of motion and good stability and symmetric strength and tone of shoulders, elbows, wrist,  knee and ankles bilaterally.  Hip: ROM IR: 25 Deg, ER: 45 Deg, Flexion: 120 Deg, Extension: 100 Deg, Abduction: 45 Deg, Adduction: 35 Deg Strength IR: 5/5, ER: 5/5, Flexion: 5/5, Extension: 5/5, Abduction: 5/5, Adduction: 5/5 Pelvic alignment unremarkable to inspection and palpation. Standing hip rotation and gait without trendelenburg sign / unsteadiness. Greater trochanter without tenderness to palpation. [name] tenderness over piriformis and greater trochanter. [name] pain with FABER or FADIR. [name] SI joint tenderness and normal minimal SI movement.  Procedure: Real-time Ultrasound Guided Injection of left intra-articular hip Device: GE Logiq Q7  Ultrasound guided injection is preferred based studies that show increased duration, increased effect, greater accuracy, decreased procedural pain, increased response rate with  ultrasound guided versus blind injection.  Verbal informed consent obtained.  Time-out conducted.  Noted [name] overlying erythema, induration, or other signs of local infection.  Skin prepped in a sterile fashion.  Local anesthesia: Topical Ethyl chloride.  With sterile technique and under real time ultrasound guidance:  Anterior capsule visualized, needle visualized going to the head neck junction at the anterior capsule. Pictures taken. Patient did have injection of 3 cc of 0.5% Marcaine, and then injected with 5 cc of precentrifuge PRP Completed without difficulty  Advised to call if fevers/chills, erythema, induration, drainage, or persistent bleeding.  Images permanently stored and available for review in the ultrasound unit.  Impression: Technically successful ultrasound guided injection.    Impression and Recommendations:     This  case required medical decision making of moderate complexity. The above documentation has been reviewed and is accurate and complete Lyndal Pulley, DO       Note: This dictation was prepared with Dragon dictation along with smaller phrase technology. Any transcriptional errors that result from this process are unintentional.

## 2019-05-30 NOTE — Assessment & Plan Note (Signed)
PRP given today.  Patient given 6 weeks increase post injection template.  Discussed avoiding certain things and what to expect for the next couple days.  Follow-up again in 3 weeks

## 2019-06-09 ENCOUNTER — Encounter: Payer: Self-pay | Admitting: Family Medicine

## 2019-06-19 ENCOUNTER — Encounter: Payer: Self-pay | Admitting: Family Medicine

## 2019-06-19 ENCOUNTER — Other Ambulatory Visit: Payer: Self-pay

## 2019-06-19 ENCOUNTER — Ambulatory Visit: Payer: 59 | Admitting: Family Medicine

## 2019-06-19 VITALS — BP 120/70 | HR 92 | Ht 61.0 in | Wt 137.0 lb

## 2019-06-19 DIAGNOSIS — G8929 Other chronic pain: Secondary | ICD-10-CM

## 2019-06-19 DIAGNOSIS — M999 Biomechanical lesion, unspecified: Secondary | ICD-10-CM

## 2019-06-19 DIAGNOSIS — M545 Low back pain: Secondary | ICD-10-CM | POA: Diagnosis not present

## 2019-06-19 NOTE — Progress Notes (Signed)
[name] [address] [place], [place] [address] Phone: [phone] Subjective:    CC: Left hip pain  [id number]   05/30/2019 PRP given today.  Patient given 6 weeks increase post injection template.  Discussed avoiding certain things and what to expect for the next couple days.  Follow-up again in 3 weeks  06/19/2019 Andrea [name] is a 49 y.o. female coming in with complaint of left hip pain. Patient states she has good and bad days. First 12 days she was uncomfortable. States her pain got a lot worse after the PRP injection. Now she is making progress.  Patient states that the back is tighter.  Thinks it is because she is not doing as much activity.  Hoping that she can increase activity.    Past Medical History:  Diagnosis Date  . 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 Arthritis of left hip    saw Dr. [name], had MRI  . Headache   . Osteopenia    Past Surgical History:  Procedure Laterality Date  . BREAST ENHANCEMENT SURGERY  4/00  . BREAST SURGERY  6/08   revision on right breast due to tight muscles  . CESAREAN SECTION    . HTA     and BTL  . OTHER SURGICAL HISTORY  10/2013   Vaginal rejuvination surgery, Dr. [name], [place]   Social History   Socioeconomic History  . Marital status: Married    Spouse name: Not on file  . Number of children: Not on file  . Years of education: Not on file  . Highest education level: Not on file  Occupational History  . Not on file  Social Needs  . Financial resource strain: Not on file  . Food insecurity    Worry: Not on file    Inability: Not on file  . Transportation needs    Medical: Not on file    Non-medical: Not on file  Tobacco Use  . Smoking status: Never Smoker  . Smokeless tobacco: Never Used  Substance and Sexual Activity  . Alcohol use: Not Currently  . Drug use: No  . Sexual activity: Yes    Partners: Male    Birth control/protection: Surgical    Comment: BTL   Lifestyle  . Physical activity    Days per week: Not on file    Minutes per session: Not on file  . Stress: Not on file  Relationships  . Social Herbalist on phone: Not on file    Gets together: Not on file    Attends religious service: Not on file    Active member of club or organization: Not on file    Attends meetings of clubs or organizations: Not on file    Relationship status: Not on file  Other Topics Concern  . Not on file  Social History Narrative  . Not on file   No Known Allergies Family History  Problem Relation Age of Onset  . Diabetes Paternal Grandmother   . Hypertension Paternal Grandmother   . Stroke Paternal Grandfather   . Congestive Heart Failure Maternal Grandfather   . Colon polyps Maternal Grandfather   . Colon polyps Mother   . Colon cancer Mother 54    Current Outpatient Medications (Endocrine & Metabolic):  [name]  NATURE-THROID 32.5 MG tablet, Take 32.5 mg by mouth every morning. .  progesterone (PROMETRIUM) 100 MG capsule,   Current Outpatient Medications (Cardiovascular):  .  nitroGLYCERIN (NITRODUR - DOSED IN MG/24 HR) 0.2 mg/hr patch, 1/2 patch daily  Current Outpatient Medications (Respiratory):  .  levocetirizine (XYZAL) 5 MG tablet,   Current Outpatient Medications (Analgesics):  .  meloxicam (MOBIC) 15 MG tablet, Take 1 tablet (15 mg total) by mouth daily. .  meloxicam (MOBIC) 7.5 MG tablet, Take 1 tablet (7.5 mg total) by mouth daily.   Current Outpatient Medications (Other):  [name]  B Complex Vitamins (B COMPLEX PO), Take by mouth. .  Calcium Carbonate (CALCIUM 600 PO), Take by mouth daily. .  Coenzyme Q10 (CO Q 10 PO), Take by mouth. .  gabapentin (NEURONTIN) 100 MG capsule, Take 2 capsules (200 mg total) by mouth at bedtime. .  metaxalone (SKELAXIN) 800 MG tablet, Take 1 tablet (800 mg total) by mouth 3 (three) times daily. .  Multiple Vitamins-Minerals (MULTIVITAMIN PO), Take by mouth daily. .  NON FORMULARY, Ashwaganda .   NONFORMULARY OR COMPOUNDED ITEM, Testosterone 2% cream .  Omega-3 Fatty Acids (FISH OIL PO), Take by mouth. .  Probiotic Product (PROBIOTIC PO), Take by mouth. .  tretinoin (RETIN-A) 0.05 % cream, APPLY CREAM TOPICALLY ONCE DAILY .  Vitamin D, Ergocalciferol, (DRISDOL) 1.25 MG (50000 UT) CAPS capsule, Take 1 capsule by mouth once a week    Past medical history, social, surgical and family history all reviewed in electronic medical record.  No pertanent information unless stated regarding to the chief complaint.   Review of Systems:  No headache, visual changes, nausea, vomiting, diarrhea, constipation, dizziness, abdominal pain, skin rash, fevers, chills, night sweats, weight loss, swollen lymph nodes, body aches, joint swelling, muscle aches, chest pain, shortness of breath, mood changes.   Objective  Blood pressure 120/70, pulse 92, height 5\' 1"  (1.549 m), weight 137 lb (62.1 kg), SpO2 99 %. Systems examined below as of    General: No apparent distress alert and oriented x3 mood and affect normal, dressed appropriately.  HEENT: Pupils equal, extraocular movements intact  Respiratory: Patient's speak in full sentences and does not appear short of breath  Cardiovascular: No lower extremity edema, non tender, no erythema  Skin: Warm dry intact with no signs of infection or rash on extremities or on axial skeleton.  Abdomen: Soft nontender  Neuro: Cranial nerves II through XII are intact, neurovascularly intact in all extremities with 2+ DTRs and 2+ pulses.  Lymph: No lymphadenopathy of posterior or anterior cervical chain or axillae bilaterally.  Gait normal with good balance and coordination.  MSK:  Non tender with full range of motion and good stability and symmetric strength and tone of shoulders, elbows, wrist,  knee and ankles bilaterally.  Left hip exam does have some limited range of motion in internal range of motion.  Patient does have tightness of the hip flexor on the left side  greater than the right.  Patient does have mild tenderness in the left piriformis as well.  Negative straight leg test.  Neurovascular intact distally.  5 out of 5 strength  Osteopathic findings  C2 flexed rotated and side bent right T3 extended rotated and side bent right inhaled third rib T9 extended rotated and side bent left L2 flexed rotated and side bent right Sacrum right on right    Impression and Recommendations:     This case required medical decision making of moderate complexity. The above documentation has been reviewed and is accurate and complete Lyndal Pulley, DO       Note: This dictation was prepared with Dragon dictation  along with smaller phrase technology. Any transcriptional errors that result from this process are unintentional.

## 2019-06-19 NOTE — Assessment & Plan Note (Signed)
Low back no significant tightness.  We will do believe that it is secondary to patient having some decrease in activity at the moment.  Discussed with patient in great length.  Patient has elected she will try to increase activity slowly.  Patient is going to be on week 4 of the return to activity progression.  Follow-up again in 4 to 8 weeks

## 2019-06-19 NOTE — Patient Instructions (Addendum)
Good to see you In week 5 25% of weight and increase 25% a week Ok to start biking daily  See me again in 4 weeks

## 2019-06-19 NOTE — Assessment & Plan Note (Signed)
Decision today to treat with OMT was based on Physical Exam  After verbal consent patient was treated with HVLA, ME, FPR techniques in  thoracic, lumbar and sacral areas  Patient tolerated the procedure well with improvement in symptoms  Patient given exercises, stretches and lifestyle modifications  See medications in patient instructions if given  Patient will follow up in 4-8 weeks 

## 2019-06-28 ENCOUNTER — Ambulatory Visit: Payer: 59 | Admitting: Family Medicine

## 2019-07-05 ENCOUNTER — Other Ambulatory Visit: Payer: Self-pay | Admitting: Family Medicine

## 2019-07-18 ENCOUNTER — Ambulatory Visit: Payer: 59 | Admitting: Family Medicine

## 2019-07-18 ENCOUNTER — Other Ambulatory Visit: Payer: Self-pay

## 2019-07-18 ENCOUNTER — Encounter: Payer: Self-pay | Admitting: Family Medicine

## 2019-07-18 VITALS — BP 102/72 | HR 103 | Ht 61.0 in | Wt 136.0 lb

## 2019-07-18 DIAGNOSIS — M999 Biomechanical lesion, unspecified: Secondary | ICD-10-CM | POA: Diagnosis not present

## 2019-07-18 DIAGNOSIS — G8929 Other chronic pain: Secondary | ICD-10-CM | POA: Diagnosis not present

## 2019-07-18 DIAGNOSIS — M545 Low back pain, unspecified: Secondary | ICD-10-CM

## 2019-07-18 NOTE — Progress Notes (Signed)
[name] [place], [place] [address] Phone: [phone] Subjective:   [name], am serving as a scribe for Dr. Hulan Saas.  This visit occurred during the SARS-CoV-2 public health emergency.  Safety protocols were in place, including screening questions prior to the visit, additional usage of staff PPE, and extensive cleaning of exam room while observing appropriate contact time as indicated for disinfecting solutions.   I'm seeing this patient by the request  of:    CC: Low back pain  [id number]   06/19/2019 OMT  Update 07/18/2019 Andrea [name] is a 48 y.o. female coming in with complaint of back pain. Patient states that she is doing better since last visit. Was using meloxicam daily during Thanksgiving week but is now off of it. Notes a pain in the hip flexor when stretching and feels that her glute does not activate with the stretch.     Past Medical History:  Diagnosis Date  . 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 Arthritis of left hip    saw Dr. [name], had MRI  . Headache   . Osteopenia    Past Surgical History:  Procedure Laterality Date  . BREAST ENHANCEMENT SURGERY  4/00  . BREAST SURGERY  6/08   revision on right breast due to tight muscles  . CESAREAN SECTION    . HTA     and BTL  . OTHER SURGICAL HISTORY  10/2013   Vaginal rejuvination surgery, Dr. [name], [place]   Social History   Socioeconomic History  . Marital status: Married    Spouse name: Not on file  . Number of children: Not on file  . Years of education: Not on file  . Highest education level: Not on file  Occupational History  . Not on file  Tobacco Use  . Smoking status: Never Smoker  . Smokeless tobacco: Never Used  Substance and Sexual Activity  . Alcohol use: Not Currently  . Drug use: No  . Sexual activity: Yes    Partners: Male    Birth control/protection: Surgical    Comment: BTL  Other Topics Concern  . Not  on file  Social History Narrative  . Not on file   Social Determinants of Health   Financial Resource Strain:   . Difficulty of Paying Living Expenses: Not on file  Food Insecurity:   . Worried About Charity fundraiser in the Last Year: Not on file  . Ran Out of Food in the Last Year: Not on file  Transportation Needs:   . Lack of Transportation (Medical): Not on file  . Lack of Transportation (Non-Medical): Not on file  Physical Activity:   . Days of Exercise per Week: Not on file  . Minutes of Exercise per Session: Not on file  Stress:   . Feeling of Stress : Not on file  Social Connections:   . Frequency of Communication with Friends and Family: Not on file  . Frequency of Social Gatherings with Friends and Family: Not on file  . Attends Religious Services: Not on file  . Active Member of Clubs or Organizations: Not on file  . Attends Archivist Meetings: Not on file  . Marital Status: Not on file   No Known Allergies Family History  Problem Relation Age of Onset  . Diabetes Paternal Grandmother   . Hypertension Paternal Grandmother   . Stroke Paternal Grandfather   . Congestive Heart  Failure Maternal Grandfather   . Colon polyps Maternal Grandfather   . Colon polyps Mother   . Colon cancer Mother 83    Current Outpatient Medications (Endocrine & Metabolic):  [name]  NATURE-THROID 32.5 MG tablet, Take 32.5 mg by mouth every morning. .  progesterone (PROMETRIUM) 100 MG capsule,   Current Outpatient Medications (Cardiovascular):  .  nitroGLYCERIN (NITRODUR - DOSED IN MG/24 HR) 0.2 mg/hr patch, 1/2 patch daily  Current Outpatient Medications (Respiratory):  .  levocetirizine (XYZAL) 5 MG tablet,   Current Outpatient Medications (Analgesics):  .  meloxicam (MOBIC) 15 MG tablet, Take 1 tablet (15 mg total) by mouth daily. .  meloxicam (MOBIC) 7.5 MG tablet, Take 1 tablet (7.5 mg total) by mouth daily.   Current Outpatient Medications (Other):  [name]  B Complex  Vitamins (B COMPLEX PO), Take by mouth. .  Calcium Carbonate (CALCIUM 600 PO), Take by mouth daily. .  Coenzyme Q10 (CO Q 10 PO), Take by mouth. .  gabapentin (NEURONTIN) 100 MG capsule, TAKE 2 CAPSULES BY MOUTH AT BEDTIME .  metaxalone (SKELAXIN) 800 MG tablet, Take 1 tablet (800 mg total) by mouth 3 (three) times daily. .  Multiple Vitamins-Minerals (MULTIVITAMIN PO), Take by mouth daily. .  NON FORMULARY, Ashwaganda .  NONFORMULARY OR COMPOUNDED ITEM, Testosterone 2% cream .  Omega-3 Fatty Acids (FISH OIL PO), Take by mouth. .  Probiotic Product (PROBIOTIC PO), Take by mouth. .  tretinoin (RETIN-A) 0.05 % cream, APPLY CREAM TOPICALLY ONCE DAILY .  Vitamin D, Ergocalciferol, (DRISDOL) 1.25 MG (50000 UT) CAPS capsule, Take 1 capsule by mouth once a week    Past medical history, social, surgical and family history all reviewed in electronic medical record.  No pertanent information unless stated regarding to the chief complaint.   Review of Systems:  No headache, visual changes, nausea, vomiting, diarrhea, constipation, dizziness, abdominal pain, skin rash, fevers, chills, night sweats, weight loss, swollen lymph nodes, body aches, joint swelling, chest pain, shortness of breath, mood changes.  Positive muscle aches  Objective  Blood pressure 102/72, pulse (!) 103, height 5\' 1"  (1.549 m), weight 136 lb (61.7 kg), SpO2 97 %.    General: No apparent distress alert and oriented x3 mood and affect normal, dressed appropriately.  HEENT: Pupils equal, extraocular movements intact  Respiratory: Patient's speak in full sentences and does not appear short of breath  Cardiovascular: No lower extremity edema, non tender, no erythema  Skin: Warm dry intact with no signs of infection or rash on extremities or on axial skeleton.  Abdomen: Soft nontender  Neuro: Cranial nerves II through XII are intact, neurovascularly intact in all extremities with 2+ DTRs and 2+ pulses.  Lymph: No lymphadenopathy  of posterior or anterior cervical chain or axillae bilaterally.  Gait normal with good balance and coordination.  MSK:  Non tender with full range of motion and good stability and symmetric strength and tone of shoulders, elbows, wrist, hip, knee and ankles bilaterally.  Back exam does have some mild loss of lordosis.  Some mild tenderness to palpation over the paraspinal musculature lumbar spine left greater than right.    Osteopathic findings  T9 extended rotated and side bent left L4 flexed rotated and side bent right Sacrum left on left   Impression and Recommendations:     This case required medical decision making of moderate complexity. The above documentation has been reviewed and is accurate and complete Lyndal Pulley, DO       Note: This  dictation was prepared with Dragon dictation along with smaller phrase technology. Any transcriptional errors that result from this process are unintentional.

## 2019-07-18 NOTE — Assessment & Plan Note (Signed)
Patient is doing significantly better at this time.  Discussed icing regimen and home exercise, discussed which activities to do which wants to avoid.  Discussed posture and ergonomics.  Follow-up again in 4 to 8 weeks

## 2019-07-18 NOTE — Patient Instructions (Signed)
  [address], [address] [place], [place] [address] Phone [phone]   Happy Holidays! See me again in 6 weeks

## 2019-07-18 NOTE — Assessment & Plan Note (Signed)
Decision today to treat with OMT was based on Physical Exam  After verbal consent patient was treated with HVLA, ME, FPR techniques in  thoracic, lumbar and sacral areas  Patient tolerated the procedure well with improvement in symptoms  Patient given exercises, stretches and lifestyle modifications  See medications in patient instructions if given  Patient will follow up in 4-8 weeks 

## 2019-08-20 ENCOUNTER — Other Ambulatory Visit: Payer: Self-pay | Admitting: Family Medicine

## 2019-08-20 NOTE — Telephone Encounter (Signed)
MyChart message sent to patient to confirm if she needs medication.

## 2019-08-29 ENCOUNTER — Ambulatory Visit: Payer: 59 | Admitting: Family Medicine

## 2019-08-29 ENCOUNTER — Other Ambulatory Visit: Payer: Self-pay

## 2019-08-29 ENCOUNTER — Encounter: Payer: Self-pay | Admitting: Family Medicine

## 2019-08-29 VITALS — BP 100/76 | HR 89 | Ht 61.0 in | Wt 140.0 lb

## 2019-08-29 DIAGNOSIS — G8929 Other chronic pain: Secondary | ICD-10-CM

## 2019-08-29 DIAGNOSIS — M1612 Unilateral primary osteoarthritis, left hip: Secondary | ICD-10-CM

## 2019-08-29 DIAGNOSIS — M999 Biomechanical lesion, unspecified: Secondary | ICD-10-CM

## 2019-08-29 DIAGNOSIS — M545 Low back pain: Secondary | ICD-10-CM | POA: Diagnosis not present

## 2019-08-29 NOTE — Assessment & Plan Note (Signed)
Doing relatively well now nearly 3 months after PRP.  Patient is to continue to increase activity.  May need to consider the possibility of repeating but I am hoping the patient does well with this still at this moment.  Follow-up again in 4 to 8 weeks

## 2019-08-29 NOTE — Progress Notes (Signed)
[place] [address] [place] Phone: [phone] Subjective:   I Andrea [name] am serving as a Education administrator for Dr. Hulan Saas.  This visit occurred during the SARS-CoV-2 public health emergency.  Safety protocols were in place, including screening questions prior to the visit, additional usage of staff PPE, and extensive cleaning of exam room while observing appropriate contact time as indicated for disinfecting solutions.    CC: Low back pain and hip pain follow-up  [id number]  AMBREA HARTENSTINE is a 49 y.o. female coming in with complaint of back pain. States she has good and bad days. Past few days have been painful. OMT.  Patient states that the hip is still doing relatively well but does have some discomfort when walking long distances.  Patient now is able to workout on a regular basis.  Takes the meloxicam in bursts when needed    Past Medical History:  Diagnosis Date  . 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 Arthritis of left hip    saw Dr. [name], had MRI  . Headache   . Osteopenia    Past Surgical History:  Procedure Laterality Date  . BREAST ENHANCEMENT SURGERY  4/00  . BREAST SURGERY  6/08   revision on right breast due to tight muscles  . CESAREAN SECTION    . HTA     and BTL  . OTHER SURGICAL HISTORY  10/2013   Vaginal rejuvination surgery, Dr. [name], [place]   Social History   Socioeconomic History  . Marital status: Married    Spouse name: Not on file  . Number of children: Not on file  . Years of education: Not on file  . Highest education level: Not on file  Occupational History  . Not on file  Tobacco Use  . Smoking status: Never Smoker  . Smokeless tobacco: Never Used  Substance and Sexual Activity  . Alcohol use: Not Currently  . Drug use: No  . Sexual activity: Yes    Partners: Male    Birth control/protection: Surgical    Comment: BTL  Other Topics Concern  . Not on file  Social  History Narrative  . Not on file   Social Determinants of Health   Financial Resource Strain:   . Difficulty of Paying Living Expenses: Not on file  Food Insecurity:   . Worried About Charity fundraiser in the Last Year: Not on file  . Ran Out of Food in the Last Year: Not on file  Transportation Needs:   . Lack of Transportation (Medical): Not on file  . Lack of Transportation (Non-Medical): Not on file  Physical Activity:   . Days of Exercise per Week: Not on file  . Minutes of Exercise per Session: Not on file  Stress:   . Feeling of Stress : Not on file  Social Connections:   . Frequency of Communication with Friends and Family: Not on file  . Frequency of Social Gatherings with Friends and Family: Not on file  . Attends Religious Services: Not on file  . Active Member of Clubs or Organizations: Not on file  . Attends Archivist Meetings: Not on file  . Marital Status: Not on file   No Known Allergies Family History  Problem Relation Age of Onset  . Diabetes Paternal Grandmother   . Hypertension Paternal Grandmother   . Stroke Paternal Grandfather   . Congestive Heart Failure Maternal Grandfather   .  Colon polyps Maternal Grandfather   . Colon polyps Mother   . Colon cancer Mother 18    Current Outpatient Medications (Endocrine & Metabolic):  [name]  NATURE-THROID 32.5 MG tablet, Take 32.5 mg by mouth every morning. .  progesterone (PROMETRIUM) 100 MG capsule,   Current Outpatient Medications (Cardiovascular):  .  nitroGLYCERIN (NITRODUR - DOSED IN MG/24 HR) 0.2 mg/hr patch, 1/2 patch daily  Current Outpatient Medications (Respiratory):  .  levocetirizine (XYZAL) 5 MG tablet,   Current Outpatient Medications (Analgesics):  .  meloxicam (MOBIC) 15 MG tablet, Take 1 tablet (15 mg total) by mouth daily. .  meloxicam (MOBIC) 7.5 MG tablet, Take 1 tablet (7.5 mg total) by mouth daily.   Current Outpatient Medications (Other):  [name]  B Complex Vitamins (B  COMPLEX PO), Take by mouth. .  Calcium Carbonate (CALCIUM 600 PO), Take by mouth daily. .  Coenzyme Q10 (CO Q 10 PO), Take by mouth. .  gabapentin (NEURONTIN) 100 MG capsule, TAKE 2 CAPSULES BY MOUTH AT BEDTIME .  metaxalone (SKELAXIN) 800 MG tablet, Take 1 tablet (800 mg total) by mouth 3 (three) times daily. .  Multiple Vitamins-Minerals (MULTIVITAMIN PO), Take by mouth daily. .  NON FORMULARY, Ashwaganda .  NONFORMULARY OR COMPOUNDED ITEM, Testosterone 2% cream .  Omega-3 Fatty Acids (FISH OIL PO), Take by mouth. .  Probiotic Product (PROBIOTIC PO), Take by mouth. .  tretinoin (RETIN-A) 0.05 % cream, APPLY CREAM TOPICALLY ONCE DAILY .  Vitamin D, Ergocalciferol, (DRISDOL) 1.25 MG (50000 UT) CAPS capsule, Take 1 capsule by mouth once a week    Past medical history, social, surgical and family history all reviewed in electronic medical record.  No pertanent information unless stated regarding to the chief complaint.   Review of Systems:  No headache, visual changes, nausea, vomiting, diarrhea, constipation, dizziness, abdominal pain, skin rash, fevers, chills, night sweats, weight loss, swollen lymph nodes, body aches, joint swelling, chest pain, shortness of breath, mood changes. POSITIVE muscle aches  Objective  Blood pressure 100/76, pulse 89, height 5\' 1"  (1.549 m), weight 140 lb (63.5 kg), SpO2 98 %.   General: No apparent distress alert and oriented x3 mood and affect normal, dressed appropriately.  HEENT: Pupils equal, extraocular movements intact  Respiratory: Patient's speak in full sentences and does not appear short of breath  Cardiovascular: No lower extremity edema, non tender, no erythema  Skin: Warm dry intact with no signs of infection or rash on extremities or on axial skeleton.  Abdomen: Soft nontender  Neuro: Cranial nerves II through XII are intact, neurovascularly intact in all extremities with 2+ DTRs and 2+ pulses.  Lymph: No lymphadenopathy of posterior or  anterior cervical chain or axillae bilaterally.  Gait normal with good balance and coordination.  MSK:  Non tender with full range of motion and good stability and symmetric strength and tone of shoulders, elbows, wrist,  knee and ankles bilaterally.  Back Exam:  Inspection: Mild loss of lordosis Motion: Flexion 45 deg, Extension 45 deg, Side Bending to 45 deg bilaterally,  Rotation to 45 deg bilaterally  SLR laying: Negative  XSLR laying: Negative  Palpable tenderness: Mild tenderness of the left gluteal area. FABER: Mild tightness. Sensory change: Gross sensation intact to all lumbar and sacral dermatomes.  Reflexes: 2+ at both patellar tendons, 2+ at achilles tendons, Babinski's downgoing.  Strength at foot  Plantar-flexion: 5/5 Dorsi-flexion: 5/5 Eversion: 5/5 Inversion: 5/5  Leg strength  Quad: 5/5 Hamstring: 5/5 Hip flexor: 5/5 Hip abductors: 5/5  Gait unremarkable.  Osteopathic findings  T6 extended rotated and side bent left L1 flexed rotated and side bent right Sacrum right on right    Impression and Recommendations:     This case required medical decision making of moderate complexity. The above documentation has been reviewed and is accurate and complete Lyndal Pulley, DO       Note: This dictation was prepared with Dragon dictation along with smaller phrase technology. Any transcriptional errors that result from this process are unintentional.

## 2019-08-29 NOTE — Patient Instructions (Addendum)
Good to see you  Keep trucking along! See me again in 6 weeks

## 2019-08-29 NOTE — Assessment & Plan Note (Signed)
Decision today to treat with OMT was based on Physical Exam  After verbal consent patient was treated with HVLA, ME, FPR techniques in  thoracic, lumbar and sacral areas  Patient tolerated the procedure well with improvement in symptoms  Patient given exercises, stretches and lifestyle modifications  See medications in patient instructions if given  Patient will follow up in 4-8 weeks 

## 2019-08-29 NOTE — Assessment & Plan Note (Signed)
Doing relatively well.  Has had some tightness on a regular basis.  Discussed which activities to do which wants to avoid.  Patient is to increase activity as tolerated.  Follow-up again in 4 to 8 weeks

## 2019-10-10 ENCOUNTER — Other Ambulatory Visit: Payer: Self-pay

## 2019-10-10 ENCOUNTER — Other Ambulatory Visit: Payer: Self-pay | Admitting: Family Medicine

## 2019-10-10 ENCOUNTER — Ambulatory Visit: Payer: Managed Care, Other (non HMO) | Admitting: Family Medicine

## 2019-10-10 ENCOUNTER — Encounter: Payer: Self-pay | Admitting: Family Medicine

## 2019-10-10 VITALS — BP 102/72 | HR 85 | Ht 61.0 in | Wt 139.0 lb

## 2019-10-10 DIAGNOSIS — G8929 Other chronic pain: Secondary | ICD-10-CM | POA: Diagnosis not present

## 2019-10-10 DIAGNOSIS — M999 Biomechanical lesion, unspecified: Secondary | ICD-10-CM | POA: Diagnosis not present

## 2019-10-10 DIAGNOSIS — M545 Low back pain: Secondary | ICD-10-CM | POA: Diagnosis not present

## 2019-10-10 NOTE — Assessment & Plan Note (Signed)
Chronic problem with stable.  Seems to be some compensation for the arthritis of the left hip and the labral tear.  Doing relatively well.  Discussed icing regimen and home exercise, discussed topical anti-inflammatories, follow-up with me again 4 to 8 weeks

## 2019-10-10 NOTE — Progress Notes (Signed)
[place] Phone: [phone] Subjective:   Andrea [name], am serving as a scribe for Dr. Hulan Saas. This visit occurred during the SARS-CoV-2 public health emergency.  Safety protocols were in place, including screening questions prior to the visit, additional usage of staff PPE, and extensive cleaning of exam room while observing appropriate contact time as indicated for disinfecting solutions.   I'm seeing this patient by the request  of:  Patient, [name] Pcp Per  CC: Hip pain and back pain follow-up  [id number]  Andrea [name] is a 49 y.o. female coming in with complaint of back and hip pain. Last seen on 08/29/2019 for OMT. Patient states that she has been doing well. Was able to squat over the weekend and did not have pain.  Patient overall has been doing relatively well.  Did squat for the first time in about a year and did fine with [name] significant follow-up.  Very happy with the results.      Past Medical History:  Diagnosis Date  . 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 Arthritis of left hip    saw Dr. [name], had MRI  . Headache   . Osteopenia    Past Surgical History:  Procedure Laterality Date  . BREAST ENHANCEMENT SURGERY  4/00  . BREAST SURGERY  6/08   revision on right breast due to tight muscles  . CESAREAN SECTION    . HTA     and BTL  . OTHER SURGICAL HISTORY  10/2013   Vaginal rejuvination surgery, Dr. [name], [place]   Social History   Socioeconomic History  . Marital status: Married    Spouse name: Not on file  . Number of children: Not on file  . Years of education: Not on file  . Highest education level: Not on file  Occupational History  . Not on file  Tobacco Use  . Smoking status: Never Smoker  . Smokeless tobacco: Never Used  Substance and Sexual Activity  . Alcohol use: Not Currently  . Drug use: [name]  . Sexual activity: Yes    Partners: Male    Birth  control/protection: Surgical    Comment: BTL  Other Topics Concern  . Not on file  Social History Narrative  . Not on file   Social Determinants of Health   Financial Resource Strain:   . Difficulty of Paying Living Expenses: Not on file  Food Insecurity:   . Worried About Charity fundraiser in the Last Year: Not on file  . Ran Out of Food in the Last Year: Not on file  Transportation Needs:   . Lack of Transportation (Medical): Not on file  . Lack of Transportation (Non-Medical): Not on file  Physical Activity:   . Days of Exercise per Week: Not on file  . Minutes of Exercise per Session: Not on file  Stress:   . Feeling of Stress : Not on file  Social Connections:   . Frequency of Communication with Friends and Family: Not on file  . Frequency of Social Gatherings with Friends and Family: Not on file  . Attends Religious Services: Not on file  . Active Member of Clubs or Organizations: Not on file  . Attends Archivist Meetings: Not on file  . Marital Status: Not on file   [name] Known Allergies Family History  Problem Relation Age of Onset  . Diabetes Paternal Grandmother   .  Hypertension Paternal Grandmother   . Stroke Paternal Grandfather   . Congestive Heart Failure Maternal Grandfather   . Colon polyps Maternal Grandfather   . Colon polyps Mother   . Colon cancer Mother 109    Current Outpatient Medications (Endocrine & Metabolic):  [name]  NATURE-THROID 32.5 MG tablet, Take 32.5 mg by mouth every morning. .  progesterone (PROMETRIUM) 100 MG capsule,   Current Outpatient Medications (Cardiovascular):  .  nitroGLYCERIN (NITRODUR - DOSED IN MG/24 HR) 0.2 mg/hr patch, 1/2 patch daily  Current Outpatient Medications (Respiratory):  .  levocetirizine (XYZAL) 5 MG tablet,   Current Outpatient Medications (Analgesics):  .  meloxicam (MOBIC) 15 MG tablet, Take 1 tablet (15 mg total) by mouth daily. .  meloxicam (MOBIC) 7.5 MG tablet, Take 1 tablet (7.5 mg total)  by mouth daily.   Current Outpatient Medications (Other):  [name]  B Complex Vitamins (B COMPLEX PO), Take by mouth. .  Calcium Carbonate (CALCIUM 600 PO), Take by mouth daily. .  Coenzyme Q10 (CO Q 10 PO), Take by mouth. .  gabapentin (NEURONTIN) 100 MG capsule, TAKE 2 CAPSULES BY MOUTH AT BEDTIME .  metaxalone (SKELAXIN) 800 MG tablet, Take 1 tablet (800 mg total) by mouth 3 (three) times daily. .  Multiple Vitamins-Minerals (MULTIVITAMIN PO), Take by mouth daily. .  NON FORMULARY, Ashwaganda .  NONFORMULARY OR COMPOUNDED ITEM, Testosterone 2% cream .  Omega-3 Fatty Acids (FISH OIL PO), Take by mouth. .  Probiotic Product (PROBIOTIC PO), Take by mouth. .  tretinoin (RETIN-A) 0.05 % cream, APPLY CREAM TOPICALLY ONCE DAILY .  Vitamin D, Ergocalciferol, (DRISDOL) 1.25 MG (50000 UT) CAPS capsule, Take 1 capsule by mouth once a week   Reviewed prior external information including notes and imaging from  primary care provider As well as notes that were available from care everywhere and other healthcare systems.  Past medical history, social, surgical and family history all reviewed in electronic medical record.  [name] pertanent information unless stated regarding to the chief complaint.   Review of Systems:  [name], visual changes, nausea, vomiting, diarrhea, constipation, dizziness, abdominal pain, skin rash, fevers, chills, night sweats, weight loss, swollen lymph nodes, body aches, joint swelling, chest pain, shortness of breath, mood changes. POSITIVE muscle aches mild compared to patient's baseline  Objective  Blood pressure 102/72, pulse 85, height 5\' 1"  (1.549 m), weight 139 lb (63 kg), SpO2 98 %.   General: [name] apparent distress alert and oriented x3 mood and affect normal, dressed appropriately.  HEENT: Pupils equal, extraocular movements intact  Respiratory: Patient's speak in full sentences and does not appear short of breath  Cardiovascular: [name] lower extremity edema, non tender,  [name] erythema  Skin: Warm dry intact with [name] signs of infection or rash on extremities or on axial skeleton.  Abdomen: Soft nontender  Neuro: Cranial nerves II through XII are intact, neurovascularly intact in all extremities with 2+ DTRs and 2+ pulses.  Lymph: [name] lymphadenopathy of posterior or anterior cervical chain or axillae bilaterally.  MSK: Mild antalgic gait mild tightness of the right hip pain left hip left is worse.  Mild positive [name] on the left side.  Negative straight leg test.  Mild tenderness in the paraspinal musculature lumbar spine left greater than right.  Osteopathic findings  T9 extended rotated and side bent left L2 flexed rotated and side bent right Sacrum left on left     Impression and Recommendations:     This case required medical decision making of  moderate complexity. The above documentation has been reviewed and is accurate and complete Lyndal Pulley, DO       Note: This dictation was prepared with Dragon dictation along with smaller phrase technology. Any transcriptional errors that result from this process are unintentional.

## 2019-10-10 NOTE — Patient Instructions (Signed)
See me in 6 weeks

## 2019-10-10 NOTE — Assessment & Plan Note (Signed)
Decision today to treat with OMT was based on Physical Exam  After verbal consent patient was treated with HVLA, ME, FPR techniques in  thoracic, lumbar and sacral areas  Patient tolerated the procedure well with improvement in symptoms  Patient given exercises, stretches and lifestyle modifications  See medications in patient instructions if given  Patient will follow up in 4-8 weeks 

## 2019-11-01 ENCOUNTER — Encounter: Payer: Self-pay | Admitting: Family Medicine

## 2019-11-01 ENCOUNTER — Telehealth: Payer: Self-pay

## 2019-11-01 NOTE — Telephone Encounter (Signed)
Write message that patient does not take medicine and was prescribe as an accident

## 2019-11-01 NOTE — Telephone Encounter (Signed)
Patient called and is getting life insurance and her life insurance wants a letter stating that the nitroglycerin that was prescribed for her husband Nielah Keo DOB 09/30/74 was accidentally put on her chart instead of his and she does not actually take that medication.

## 2019-11-04 NOTE — Telephone Encounter (Signed)
Message sent via mychart. Will call patient to notify.

## 2019-11-21 ENCOUNTER — Encounter: Payer: Self-pay | Admitting: Family Medicine

## 2019-11-21 ENCOUNTER — Other Ambulatory Visit: Payer: Self-pay

## 2019-11-21 ENCOUNTER — Ambulatory Visit: Payer: 59 | Admitting: Family Medicine

## 2019-11-21 VITALS — BP 110/62 | HR 81 | Ht 61.0 in | Wt 138.0 lb

## 2019-11-21 DIAGNOSIS — M545 Low back pain, unspecified: Secondary | ICD-10-CM

## 2019-11-21 DIAGNOSIS — M999 Biomechanical lesion, unspecified: Secondary | ICD-10-CM | POA: Diagnosis not present

## 2019-11-21 DIAGNOSIS — G8929 Other chronic pain: Secondary | ICD-10-CM | POA: Diagnosis not present

## 2019-11-21 NOTE — Assessment & Plan Note (Signed)

## 2019-11-21 NOTE — Patient Instructions (Signed)
Doing great See me again in 6 weeks

## 2019-11-21 NOTE — Assessment & Plan Note (Signed)
Patient does have known hip arthritis p.  We will continue to monitor.  Patient will increase activity slowly.  Follow-up again in 4 to 8 weeks

## 2019-11-21 NOTE — Progress Notes (Signed)
[place] Phone: [phone] Subjective:   [name], am serving as a scribe for Dr. Hulan Saas. This visit occurred during the SARS-CoV-2 public health emergency.  Safety protocols were in place, including screening questions prior to the visit, additional usage of staff PPE, and extensive cleaning of exam room while observing appropriate contact time as indicated for disinfecting solutions.   I'm seeing this patient by the request  of:  Patient, No Pcp Per  CC: Hip and back pain follow-up  [id number]  Andrea [name] is a 49 y.o. female coming in with complaint of back pain. Last seen on 10/10/2019 for OMT. Patient states that her hip and back have been doing well since last visit. Unable to walk barefoot without getting pain in her back.       Past Medical History:  Diagnosis Date  . 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 Arthritis of left hip    saw Dr. [name], had MRI  . Headache   . Osteopenia    Past Surgical History:  Procedure Laterality Date  . BREAST ENHANCEMENT SURGERY  4/00  . BREAST SURGERY  6/08   revision on right breast due to tight muscles  . CESAREAN SECTION    . HTA     and BTL  . OTHER SURGICAL HISTORY  10/2013   Vaginal rejuvination surgery, Dr. [name], [place]   Social History   Socioeconomic History  . Marital status: Married    Spouse name: Not on file  . Number of children: Not on file  . Years of education: Not on file  . Highest education level: Not on file  Occupational History  . Not on file  Tobacco Use  . Smoking status: Never Smoker  . Smokeless tobacco: Never Used  Substance and Sexual Activity  . Alcohol use: Not Currently  . Drug use: No  . Sexual activity: Yes    Partners: Male    Birth control/protection: Surgical    Comment: BTL  Other Topics Concern  . Not on file  Social History Narrative  . Not on file   Social Determinants of  Health   Financial Resource Strain:   . Difficulty of Paying Living Expenses:   Food Insecurity:   . Worried About Charity fundraiser in the Last Year:   . Arboriculturist in the Last Year:   Transportation Needs:   . Film/video editor (Medical):   [name] Lack of Transportation (Non-Medical):   Physical Activity:   . Days of Exercise per Week:   . Minutes of Exercise per Session:   Stress:   . Feeling of Stress :   Social Connections:   . Frequency of Communication with Friends and Family:   . Frequency of Social Gatherings with Friends and Family:   . Attends Religious Services:   . Active Member of Clubs or Organizations:   . Attends Archivist Meetings:   [name] Marital Status:    No Known Allergies Family History  Problem Relation Age of Onset  . Diabetes Paternal Grandmother   . Hypertension Paternal Grandmother   . Stroke Paternal Grandfather   . Congestive Heart Failure Maternal Grandfather   . Colon polyps Maternal Grandfather   . Colon polyps Mother   . Colon cancer Mother 74    Current Outpatient Medications (Endocrine & Metabolic):  [name]  NATURE-THROID 32.5 MG tablet, Take 32.5 mg  by mouth every morning. .  progesterone (PROMETRIUM) 100 MG capsule,   Current Outpatient Medications (Cardiovascular):  .  nitroGLYCERIN (NITRODUR - DOSED IN MG/24 HR) 0.2 mg/hr patch, 1/2 patch daily  Current Outpatient Medications (Respiratory):  .  levocetirizine (XYZAL) 5 MG tablet,   Current Outpatient Medications (Analgesics):  .  meloxicam (MOBIC) 15 MG tablet, Take 1 tablet (15 mg total) by mouth daily. .  meloxicam (MOBIC) 7.5 MG tablet, Take 1 tablet (7.5 mg total) by mouth daily.   Current Outpatient Medications (Other):  [name]  B Complex Vitamins (B COMPLEX PO), Take by mouth. .  Calcium Carbonate (CALCIUM 600 PO), Take by mouth daily. .  Coenzyme Q10 (CO Q 10 PO), Take by mouth. .  gabapentin (NEURONTIN) 100 MG capsule, TAKE 2 CAPSULES BY MOUTH AT BEDTIME .   metaxalone (SKELAXIN) 800 MG tablet, Take 1 tablet (800 mg total) by mouth 3 (three) times daily. .  Multiple Vitamins-Minerals (MULTIVITAMIN PO), Take by mouth daily. .  NON FORMULARY, Ashwaganda .  NONFORMULARY OR COMPOUNDED ITEM, Testosterone 2% cream .  Omega-3 Fatty Acids (FISH OIL PO), Take by mouth. .  Probiotic Product (PROBIOTIC PO), Take by mouth. .  tretinoin (RETIN-A) 0.05 % cream, APPLY CREAM TOPICALLY ONCE DAILY .  Vitamin D, Ergocalciferol, (DRISDOL) 1.25 MG (50000 UT) CAPS capsule, Take 1 capsule by mouth once a week   Reviewed prior external information including notes and imaging from  primary care provider As well as notes that were available from care everywhere and other healthcare systems.  Past medical history, social, surgical and family history all reviewed in electronic medical record.  No pertanent information unless stated regarding to the chief complaint.   Review of Systems:  No headache, visual changes, nausea, vomiting, diarrhea, constipation, dizziness, abdominal pain, skin rash, fevers, chills, night sweats, weight loss, swollen lymph nodes, body aches, joint swelling, chest pain, shortness of breath, mood changes. POSITIVE muscle aches  Objective  Blood pressure 110/62, pulse 81, height 5\' 1"  (1.549 m), weight 138 lb (62.6 kg), SpO2 98 %.   General: No apparent distress alert and oriented x3 mood and affect normal, dressed appropriately.  HEENT: Pupils equal, extraocular movements intact  Respiratory: Patient's speak in full sentences and does not appear short of breath  Cardiovascular: No lower extremity edema, non tender, no erythema  Neuro: Cranial nerves II through XII are intact, neurovascularly intact in all extremities with 2+ DTRs and 2+ pulses.  Gait normal with good balance and coordination.  MSK:  tender with full range of motion and good stability and symmetric strength and tone of shoulders, elbows, wrist,  knee and ankles bilaterally.    Good muscle tone of patient's body.  Patient still has some limited range of motion of the left hip.  Patient still has some tenderness in the paraspinal musculature mostly around the right sacroiliac joint.  Negative straight leg test.  Osteopathic findings   T7 extended rotated and side bent left L2 flexed rotated and side bent right Sacrum right on right    Impression and Recommendations:     This case required medical decision making of moderate complexity. The above documentation has been reviewed and is accurate and complete Lyndal Pulley, DO       Note: This dictation was prepared with Dragon dictation along with smaller phrase technology. Any transcriptional errors that result from this process are unintentional.

## 2019-12-03 ENCOUNTER — Encounter: Payer: Self-pay | Admitting: Family Medicine

## 2019-12-05 ENCOUNTER — Other Ambulatory Visit: Payer: Self-pay | Admitting: Family Medicine

## 2019-12-31 ENCOUNTER — Encounter: Payer: Self-pay | Admitting: Family Medicine

## 2019-12-31 ENCOUNTER — Other Ambulatory Visit: Payer: Self-pay

## 2019-12-31 ENCOUNTER — Ambulatory Visit: Payer: 59 | Admitting: Family Medicine

## 2019-12-31 VITALS — BP 100/60 | HR 81 | Ht 61.0 in | Wt 138.0 lb

## 2019-12-31 DIAGNOSIS — G8929 Other chronic pain: Secondary | ICD-10-CM | POA: Diagnosis not present

## 2019-12-31 DIAGNOSIS — M999 Biomechanical lesion, unspecified: Secondary | ICD-10-CM | POA: Diagnosis not present

## 2019-12-31 DIAGNOSIS — M545 Low back pain: Secondary | ICD-10-CM

## 2019-12-31 NOTE — Assessment & Plan Note (Signed)

## 2019-12-31 NOTE — Assessment & Plan Note (Signed)
Chronic problem with an exacerbation.  Does have mild L4-S1 osteoarthritic changes of the back and was independently visualized again.  We discussed the gabapentin at night.  Meloxicam during the day.  Patient has Skelaxin for breakthrough pain as well.  Discussed icing regimen and home exercises, which activities to doing which wants to avoid.  Patient is to increase activity as tolerated.  Follow-up again in 4 to 8 weeks

## 2019-12-31 NOTE — Progress Notes (Signed)
[place] [address] [place] Phone: [phone] Subjective:   I [name] am serving as a Education administrator for Dr. Hulan Saas.  This visit occurred during the SARS-CoV-2 public health emergency.  Safety protocols were in place, including screening questions prior to the visit, additional usage of staff PPE, and extensive cleaning of exam room while observing appropriate contact time as indicated for disinfecting solutions.   I'm seeing this patient by the request  of:  Patient, No Pcp Per  CC: Right back pain, left hip pain  [id number]  Andrea [name] is a 49 y.o. female coming in with complaint ofhip pain. Last seen on 11/21/2019 for OMT. Patient states this past month has been "horrible". Within a week after her last adjustment she states she felt like she "jammed" her hip. There is a constant annoying pain in the hip. Muscle spasm on the right side of her back.  Patient did play soccer with some kids and thinks that that contributed to some of the pain at this moment.       Past Medical History:  Diagnosis Date  . 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 Arthritis of left hip    saw Dr. [name], had MRI  . Headache   . Osteopenia    Past Surgical History:  Procedure Laterality Date  . BREAST ENHANCEMENT SURGERY  4/00  . BREAST SURGERY  6/08   revision on right breast due to tight muscles  . CESAREAN SECTION    . HTA     and BTL  . OTHER SURGICAL HISTORY  10/2013   Vaginal rejuvination surgery, Dr. [name], [place]   Social History   Socioeconomic History  . Marital status: Married    Spouse name: Not on file  . Number of children: Not on file  . Years of education: Not on file  . Highest education level: Not on file  Occupational History  . Not on file  Tobacco Use  . Smoking status: Never Smoker  . Smokeless tobacco: Never Used  Substance and Sexual Activity  . Alcohol use: Not Currently  . Drug use: No    . Sexual activity: Yes    Partners: Male    Birth control/protection: Surgical    Comment: BTL  Other Topics Concern  . Not on file  Social History Narrative  . Not on file   Social Determinants of Health   Financial Resource Strain:   . Difficulty of Paying Living Expenses:   Food Insecurity:   . Worried About Charity fundraiser in the Last Year:   . Arboriculturist in the Last Year:   Transportation Needs:   . Film/video editor (Medical):   [name] Lack of Transportation (Non-Medical):   Physical Activity:   . Days of Exercise per Week:   . Minutes of Exercise per Session:   Stress:   . Feeling of Stress :   Social Connections:   . Frequency of Communication with Friends and Family:   . Frequency of Social Gatherings with Friends and Family:   . Attends Religious Services:   . Active Member of Clubs or Organizations:   . Attends Archivist Meetings:   [name] Marital Status:    No Known Allergies Family History  Problem Relation Age of Onset  . Diabetes Paternal Grandmother   . Hypertension Paternal Grandmother   . Stroke Paternal Grandfather   . Congestive Heart Failure  Maternal Grandfather   . Colon polyps Maternal Grandfather   . Colon polyps Mother   . Colon cancer Mother 9    Current Outpatient Medications (Endocrine & Metabolic):  [name]  NATURE-THROID 32.5 MG tablet, Take 32.5 mg by mouth every morning. .  progesterone (PROMETRIUM) 100 MG capsule,   Current Outpatient Medications (Cardiovascular):  .  nitroGLYCERIN (NITRODUR - DOSED IN MG/24 HR) 0.2 mg/hr patch, 1/2 patch daily  Current Outpatient Medications (Respiratory):  .  levocetirizine (XYZAL) 5 MG tablet,   Current Outpatient Medications (Analgesics):  .  meloxicam (MOBIC) 15 MG tablet, Take 1 tablet (15 mg total) by mouth daily. .  meloxicam (MOBIC) 7.5 MG tablet, Take 1 tablet by mouth once daily   Current Outpatient Medications (Other):  [name]  B Complex Vitamins (B COMPLEX PO), Take by  mouth. .  Calcium Carbonate (CALCIUM 600 PO), Take by mouth daily. .  Coenzyme Q10 (CO Q 10 PO), Take by mouth. .  gabapentin (NEURONTIN) 100 MG capsule, TAKE 2 CAPSULES BY MOUTH AT BEDTIME .  metaxalone (SKELAXIN) 800 MG tablet, Take 1 tablet (800 mg total) by mouth 3 (three) times daily. .  Multiple Vitamins-Minerals (MULTIVITAMIN PO), Take by mouth daily. .  NON FORMULARY, Ashwaganda .  NONFORMULARY OR COMPOUNDED ITEM, Testosterone 2% cream .  Omega-3 Fatty Acids (FISH OIL PO), Take by mouth. .  Probiotic Product (PROBIOTIC PO), Take by mouth. .  tretinoin (RETIN-A) 0.05 % cream, APPLY CREAM TOPICALLY ONCE DAILY .  Vitamin D, Ergocalciferol, (DRISDOL) 1.25 MG (50000 UT) CAPS capsule, Take 1 capsule by mouth once a week   Reviewed prior external information including notes and imaging from  primary care provider As well as notes that were available from care everywhere and other healthcare systems.  Past medical history, social, surgical and family history all reviewed in electronic medical record.  No pertanent information unless stated regarding to the chief complaint.   Review of Systems:  No headache, visual changes, nausea, vomiting, diarrhea, constipation, dizziness, abdominal pain, skin rash, fevers, chills, night sweats, weight loss, swollen lymph nodes, body aches, joint swelling, chest pain, shortness of breath, mood changes. POSITIVE muscle aches  Objective  There were no vitals taken for this visit.   General: No apparent distress alert and oriented x3 mood and affect normal, dressed appropriately.  HEENT: Pupils equal, extraocular movements intact  Respiratory: Patient's speak in full sentences and does not appear short of breath  Cardiovascular: No lower extremity edema, non tender, no erythema  Neuro: Cranial nerves II through XII are intact, neurovascularly intact in all extremities with 2+ DTRs and 2+ pulses.  Gait normal with good balance and coordination.  MSK:  Continues to have some mild limited range of motion in both hips left greater than right especially with internal range of motion.  Tightness in the paraspinal musculature of the lumbar spine regarding the left.  Osteopathic findings   T7 extended rotated and side bent left L3 flexed rotated and side bent left Sacrum right on right     Impression and Recommendations:     This case required medical decision making of moderate complexity. The above documentation has been reviewed and is accurate and complete Lyndal Pulley, DO       Note: This dictation was prepared with Dragon dictation along with smaller phrase technology. Any transcriptional errors that result from this process are unintentional.

## 2019-12-31 NOTE — Patient Instructions (Signed)
Keep stretching  No change in medication See me again in 3-4 weeks

## 2020-01-09 ENCOUNTER — Other Ambulatory Visit: Payer: Self-pay | Admitting: Family Medicine

## 2020-01-18 ENCOUNTER — Other Ambulatory Visit: Payer: Self-pay | Admitting: Family Medicine

## 2020-01-29 ENCOUNTER — Other Ambulatory Visit: Payer: Self-pay

## 2020-01-29 ENCOUNTER — Ambulatory Visit: Payer: 59 | Admitting: Family Medicine

## 2020-01-29 ENCOUNTER — Encounter: Payer: Self-pay | Admitting: Family Medicine

## 2020-01-29 VITALS — BP 104/78 | HR 80 | Ht 61.0 in | Wt 131.0 lb

## 2020-01-29 DIAGNOSIS — M999 Biomechanical lesion, unspecified: Secondary | ICD-10-CM | POA: Diagnosis not present

## 2020-01-29 DIAGNOSIS — M1612 Unilateral primary osteoarthritis, left hip: Secondary | ICD-10-CM

## 2020-01-29 NOTE — Progress Notes (Signed)
[place] Phone: [phone] Subjective:   [name], am serving as a scribe for Dr. Hulan Saas. This visit occurred during the SARS-CoV-2 public health emergency.  Safety protocols were in place, including screening questions prior to the visit, additional usage of staff PPE, and extensive cleaning of exam room while observing appropriate contact time as indicated for disinfecting solutions.   I'm seeing this patient by the request  of:  Patient, No Pcp Per  CC: Left hip and back pain follow-up  [id number]  Andrea [name] is a 49 y.o. female coming in with complaint of back and neck pain. OMT 12/31/2019. Patient states that she has been having intermittent pain. Left hip pain increased last week. Using gabapentin in mornings which has helped. States that she is uncomfortable due to left hip pain. States that pain is deep in hip but not in groin. Pain in mornings and after workouts.   Medications patient has been prescribed: Meloxicam intermittently, Skelaxin intermittently          Reviewed prior external information including notes and imaging from previsou exam, outside providers and external EMR if available.   As well as notes that were available from care everywhere and other healthcare systems.  Past medical history, social, surgical and family history all reviewed in electronic medical record.  No pertanent information unless stated regarding to the chief complaint.   Past Medical History:  Diagnosis Date  . 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 Arthritis of left hip    saw Dr. [name], had MRI  . Headache   . Osteopenia     No Known Allergies   Review of Systems:  No headache, visual changes, nausea, vomiting, diarrhea, constipation, dizziness, abdominal pain, skin rash, fevers, chills, night sweats, weight loss, swollen lymph nodes, body aches, joint swelling, chest pain, shortness  of breath, mood changes. POSITIVE muscle aches  Objective  Blood pressure 104/78, pulse 80, height 5\' 1"  (1.549 m), weight 131 lb (59.4 kg), SpO2 97 %.   General: No apparent distress alert and oriented x3 mood and affect normal, dressed appropriately.  HEENT: Pupils equal, extraocular movements intact  Respiratory: Patient's speak in full sentences and does not appear short of breath  Cardiovascular: No lower extremity edema, non tender, no erythema  Neuro: Cranial nerves II through XII are intact, neurovascularly intact in all extremities with 2+ DTRs and 2+ pulses.  Gait normal with good balance and coordination.  MSK: Left hip mild decrease in range of motion.  Osteopathic findings  T9 extended rotated and side bent left L1 flexed rotated and side bent right Sacrum left on left Left posterior ilium       Assessment and Plan:  Arthritis of left hip Arthritis of the left hip.  I believe that patient still is doing relatively well overall.  Patient does have some of the labral tearing noted and we will continue to monitor as well.  Increase activity slowly.  Discussed icing regimen.  Follow-up again in 4 to 8 weeks otherwise.    Nonallopathic problems  Decision today to treat with OMT was based on Physical Exam  After verbal consent patient was treated with HVLA, ME, FPR techniques in  thoracic, lumbar, and sacral  areas  Patient tolerated the procedure well with improvement in symptoms  Patient given exercises, stretches and lifestyle modifications  See medications in patient instructions if given  Patient will follow up  in 4-8 weeks      The above documentation has been reviewed and is accurate and complete Lyndal Pulley, DO       Note: This dictation was prepared with Dragon dictation along with smaller phrase technology. Any transcriptional errors that result from this process are unintentional.

## 2020-01-29 NOTE — Assessment & Plan Note (Signed)
Arthritis of the left hip.  I believe that patient still is doing relatively well overall.  Patient does have some of the labral tearing noted and we will continue to monitor as well.  Increase activity slowly.  Discussed icing regimen.  Follow-up again in 4 to 8 weeks otherwise.

## 2020-01-29 NOTE — Patient Instructions (Signed)
See me again in 6 weeks 

## 2020-01-31 ENCOUNTER — Ambulatory Visit: Payer: 59 | Admitting: Obstetrics & Gynecology

## 2020-03-06 ENCOUNTER — Other Ambulatory Visit: Payer: Self-pay

## 2020-03-06 ENCOUNTER — Encounter: Payer: Self-pay | Admitting: Family Medicine

## 2020-03-06 ENCOUNTER — Ambulatory Visit: Payer: 59 | Admitting: Family Medicine

## 2020-03-06 VITALS — BP 110/68 | HR 75 | Ht 61.0 in | Wt 136.0 lb

## 2020-03-06 DIAGNOSIS — M999 Biomechanical lesion, unspecified: Secondary | ICD-10-CM

## 2020-03-06 DIAGNOSIS — M1612 Unilateral primary osteoarthritis, left hip: Secondary | ICD-10-CM | POA: Diagnosis not present

## 2020-03-06 NOTE — Progress Notes (Signed)
[place] Phone: [phone] Subjective:   [name], am serving as a scribe for Dr. Hulan Saas. This visit occurred during the SARS-CoV-2 public health emergency.  Safety protocols were in place, including screening questions prior to the visit, additional usage of staff PPE, and extensive cleaning of exam room while observing appropriate contact time as indicated for disinfecting solutions.   I'm seeing this patient by the request  of:  Patient, No Pcp Per  CC: Neck and back pain follow-up  [id number]  TONIQUA MELAMED is a 49 y.o. female coming in with complaint of back and neck pain. OMT 01/29/2020. Patient states that she has been using meloxicam every other day. Does feel like it helps alleviate her pain but wants to know long term use is healthy. Pain is in left hip in the groin.   Medications patient has been prescribed: Gabapentin every night and meloxicam 3 times a week          Reviewed prior external information including notes and imaging from previsou exam, outside providers and external EMR if available.   As well as notes that were available from care everywhere and other healthcare systems.  Past medical history, social, surgical and family history all reviewed in electronic medical record.  No pertanent information unless stated regarding to the chief complaint.   Past Medical History:  Diagnosis Date  . 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 Arthritis of left hip    saw Dr. [name], had MRI  . Headache   . Osteopenia     No Known Allergies   Review of Systems:  No headache, visual changes, nausea, vomiting, diarrhea, constipation, dizziness, abdominal pain, skin rash, fevers, chills, night sweats, weight loss, swollen lymph nodes, body aches, joint swelling, chest pain, shortness of breath, mood changes. POSITIVE muscle aches  Objective  Blood pressure 110/68, pulse 75,  height 5\' 1"  (1.549 m), weight 136 lb (61.7 kg), SpO2 98 %.   General: No apparent distress alert and oriented x3 mood and affect normal, dressed appropriately.  HEENT: Pupils equal, extraocular movements intact  Respiratory: Patient's speak in full sentences and does not appear short of breath  Cardiovascular: No lower extremity edema, non tender, no erythema  Neuro: Cranial nerves II through XII are intact, neurovascularly intact in all extremities with 2+ DTRs and 2+ pulses.  Gait normal with good balance and coordination.  MSK:  Non tender with full range of motion and good stability and symmetric strength and tone of shoulders, elbows, wrist, hip, knee and ankles bilaterally.  Back - Normal skin, Spine with normal alignment and no deformity.  No tenderness to vertebral process palpation.  Paraspinous muscles are not tender and without spasm.   Range of motion is full at neck and lumbar sacral regions  Osteopathic findings  T5 extended rotated and side bent left L1 flexed rotated and side bent right Sacrum left on left       Assessment and Plan:  Arthritis of left hip Stable.  Doing relatively well.  Nearly 1 year out from PRP.  Will consider the possibility of another injection if necessary.  Discussed icing regimen and home exercises, responding well to manipulation.  Follow-up again in 6 weeks.    Nonallopathic problems  Decision today to treat with OMT was based on Physical Exam  After verbal consent patient was treated with HVLA, ME, FPR techniques in  thoracic, lumbar, and  sacral  areas  Patient tolerated the procedure well with improvement in symptoms  Patient given exercises, stretches and lifestyle modifications  See medications in patient instructions if given  Patient will follow up in 4-8 weeks      The above documentation has been reviewed and is accurate and complete Lyndal Pulley, DO       Note: This dictation was prepared with Dragon dictation  along with smaller phrase technology. Any transcriptional errors that result from this process are unintentional.

## 2020-03-06 NOTE — Assessment & Plan Note (Signed)
Stable.  Doing relatively well.  Nearly 1 year out from PRP.  Will consider the possibility of another injection if necessary.  Discussed icing regimen and home exercises, responding well to manipulation.  Follow-up again in 6 weeks.

## 2020-03-06 NOTE — Patient Instructions (Signed)
You are awesome  Keep it up  [employer]  Meloxicam is fine See me again in 6 weeks

## 2020-03-26 ENCOUNTER — Other Ambulatory Visit: Payer: Self-pay | Admitting: Family Medicine

## 2020-04-17 NOTE — Progress Notes (Signed)
[name] Phone: [phone] Subjective:   [name], am serving as a scribe for Dr. Hulan Saas. This visit occurred during the SARS-CoV-2 public health emergency.  Safety protocols were in place, including screening questions prior to the visit, additional usage of staff PPE, and extensive cleaning of exam room while observing appropriate contact time as indicated for disinfecting solutions.   I'm seeing this patient by the request  of:  Patient, No Pcp Per  CC: Left hip and back pain follow-up  [id number]  MARA FAVERO is a 49 y.o. female coming in with complaint of back and neck pain. OMT 03/06/2020. Patient states that she has been having hip flexor tightness more than usual. Did have a lower back flare a few weeks ago. Did some horse back riding without stirrups that may have aggravated the hip.  Medications patient has been prescribed: Gabapentin, Vit D, Melsoicam, Skelaxin  Taking: Yes every other day         Reviewed prior external information including notes and imaging from previsou exam, outside providers and external EMR if available.   As well as notes that were available from care everywhere and other healthcare systems.  Past medical history, social, surgical and family history all reviewed in electronic medical record.  No pertanent information unless stated regarding to the chief complaint.   Past Medical History:  Diagnosis Date  . 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 Arthritis of left hip    saw Dr. [name], had MRI  . Headache   . Osteopenia     No Known Allergies   Review of Systems:  No headache, visual changes, nausea, vomiting, diarrhea, constipation, dizziness, abdominal pain, skin rash, fevers, chills, night sweats, weight loss, swollen lymph nodes, body aches, joint swelling, chest pain, shortness of breath, mood changes. POSITIVE muscle aches  Objective    Blood pressure 102/74, pulse 71, height 5\' 1"  (1.549 m), weight 136 lb (61.7 kg), SpO2 98 %.   General: No apparent distress alert and oriented x3 mood and affect normal, dressed appropriately.  HEENT: Pupils equal, extraocular movements intact  Respiratory: Patient's speak in full sentences and does not appear short of breath  Cardiovascular: No lower extremity edema, non tender, no erythema  Neuro: Cranial nerves II through XII are intact, neurovascularly intact in all extremities with 2+ DTRs and 2+ pulses.  Gait normal with good balance and coordination.  MSK:  Non tender with full range of motion and good stability and symmetric strength and tone of shoulders, elbows, wrist, hip, knee and ankles bilaterally.  Back -low back exam does have some mild loss of lordosis, tightness noted more with a FABER test on the left side.  Decreasing range of motion in all planes.  Osteopathic findings  T7 extended rotated and side bent left L4 flexed rotated and side bent right Sacrum left on left       Assessment and Plan:  Low back pain Low back with some mild degenerative disc disease but likely contributing more from the arthritis of the left hip.  Patient did respond well to PRP with the hip.  Continuing the home exercises, icing regimen.  Chronic problem but seems to be stable in this individual.  Stress likely playing a role continue the home exercises otherwise.  Follow-up again in 6 weeks    Nonallopathic problems  Decision today to treat with OMT was based on Physical Exam  After verbal consent patient was treated with HVLA, ME, FPR techniques in  thoracic, lumbar, and sacral  areas  Patient tolerated the procedure well with improvement in symptoms  Patient given exercises, stretches and lifestyle modifications  See medications in patient instructions if given  Patient will follow up in 6 weeks      The above documentation has been reviewed and is accurate and complete  Lyndal Pulley, DO       Note: This dictation was prepared with Dragon dictation along with smaller phrase technology. Any transcriptional errors that result from this process are unintentional.

## 2020-04-21 ENCOUNTER — Ambulatory Visit: Payer: 59 | Admitting: Family Medicine

## 2020-04-21 ENCOUNTER — Encounter: Payer: Self-pay | Admitting: Family Medicine

## 2020-04-21 ENCOUNTER — Other Ambulatory Visit: Payer: Self-pay

## 2020-04-21 VITALS — BP 102/74 | HR 71 | Ht 61.0 in | Wt 136.0 lb

## 2020-04-21 DIAGNOSIS — M545 Low back pain, unspecified: Secondary | ICD-10-CM

## 2020-04-21 DIAGNOSIS — G8929 Other chronic pain: Secondary | ICD-10-CM

## 2020-04-21 DIAGNOSIS — M999 Biomechanical lesion, unspecified: Secondary | ICD-10-CM | POA: Diagnosis not present

## 2020-04-21 NOTE — Patient Instructions (Addendum)
Enjoy the horses but wear stirups See me again in 6 weeks

## 2020-04-21 NOTE — Assessment & Plan Note (Signed)
Low back with some mild degenerative disc disease but likely contributing more from the arthritis of the left hip.  Patient did respond well to PRP with the hip.  Continuing the home exercises, icing regimen.  Chronic problem but seems to be stable in this individual.  Stress likely playing a role continue the home exercises otherwise.  Follow-up again in 6 weeks

## 2020-05-08 ENCOUNTER — Other Ambulatory Visit: Payer: Self-pay | Admitting: Family Medicine

## 2020-05-27 NOTE — Progress Notes (Signed)
[name] [place] Phone: [phone] Subjective:   Andrea [name], am serving as a scribe for Dr. Hulan Saas. This visit occurred during the SARS-CoV-2 public health emergency.  Safety protocols were in place, including screening questions prior to the visit, additional usage of staff PPE, and extensive cleaning of exam room while observing appropriate contact time as indicated for disinfecting solutions.   I'm seeing this patient by the request  of:  Patient, No Pcp Per  CC: Back and hip pain follow-up  [id number]  Andrea [name] is a 49 y.o. female coming in with complaint of back and neck pain. OMT 04/22/2020. Patient states   Medications patient has been prescribed: Gabapentin, Meloxicam, Vitamin D  Taking: Yes         Reviewed prior external information including notes and imaging from previsou exam, outside providers and external EMR if available.   As well as notes that were available from care everywhere and other healthcare systems.  Past medical history, social, surgical and family history all reviewed in electronic medical record.  No pertanent information unless stated regarding to the chief complaint.   Past Medical History:  Diagnosis Date  . 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 Arthritis of left hip    saw Dr. [name], had MRI  . Headache   . Osteopenia     No Known Allergies   Review of Systems:  No headache, visual changes, nausea, vomiting, diarrhea, constipation, dizziness, abdominal pain, skin rash, fevers, chills, night sweats, weight loss, swollen lymph nodes,  joint swelling, chest pain, shortness of breath, mood changes. POSITIVE muscle aches, body aches  Objective  Blood pressure 100/60, pulse 69, height 5\' 1"  (1.549 m), weight 134 lb (60.8 kg), SpO2 98 %.   General: No apparent distress alert and oriented x3 mood and affect normal, dressed appropriately.  HEENT: Pupils  equal, extraocular movements intact  Respiratory: Patient's speak in full sentences and does not appear short of breath  Cardiovascular: No lower extremity edema, non tender, no erythema  Neuro: Cranial nerves II through XII are intact, neurovascularly intact in all extremities with 2+ DTRs and 2+ pulses.  Gait normal with good balance and coordination.  MSK:   Back -low back exam she does have some loss of lordosis.  Some tenderness to palpation in the paraspinal musculature of the lumbar spine.  Tightness noted of the left hip still noted.  Decreased range of motion of the left hip noted  Osteopathic findings  T8 extended rotated and side bent left L2 flexed rotated and side bent right Sacrum left on left       Assessment and Plan:  Low back pain Low back pain with tightness secondary to mild arthritis of the left hip.  Patient has been doing relatively well.  We discussed the meloxicam again as well as the gabapentin.  Patient does have this chronic problem with mild exacerbation.  If it backs up we can consider injection again.  Follow-up with me again 6 to 8 weeks.    Nonallopathic problems  Decision today to treat with OMT was based on Physical Exam  After verbal consent patient was treated with HVLA, ME, FPR techniques in  thoracic, lumbar, and sacral  areas  Patient tolerated the procedure well with improvement in symptoms  Patient given exercises, stretches and lifestyle modifications  See medications in patient instructions if given  Patient will follow up in 4-8 weeks  The above documentation has been reviewed and is accurate and complete Lyndal Pulley, DO       Note: This dictation was prepared with Dragon dictation along with smaller phrase technology. Any transcriptional errors that result from this process are unintentional.

## 2020-05-28 ENCOUNTER — Encounter: Payer: Self-pay | Admitting: Family Medicine

## 2020-05-28 ENCOUNTER — Other Ambulatory Visit: Payer: Self-pay

## 2020-05-28 ENCOUNTER — Ambulatory Visit: Payer: 59 | Admitting: Family Medicine

## 2020-05-28 VITALS — BP 100/60 | HR 69 | Ht 61.0 in | Wt 134.0 lb

## 2020-05-28 DIAGNOSIS — M545 Low back pain, unspecified: Secondary | ICD-10-CM

## 2020-05-28 DIAGNOSIS — M999 Biomechanical lesion, unspecified: Secondary | ICD-10-CM | POA: Diagnosis not present

## 2020-05-28 DIAGNOSIS — G8929 Other chronic pain: Secondary | ICD-10-CM

## 2020-05-28 NOTE — Assessment & Plan Note (Signed)
Low back pain with tightness secondary to mild arthritis of the left hip.  Patient has been doing relatively well.  We discussed the meloxicam again as well as the gabapentin.  Patient does have this chronic problem with mild exacerbation.  If it backs up we can consider injection again.  Follow-up with me again 6 to 8 weeks.

## 2020-05-28 NOTE — Patient Instructions (Addendum)
Good to see you  Always great to see you Keep watching the hip Enjoy the beach See me again in 6 weeks

## 2020-06-08 ENCOUNTER — Other Ambulatory Visit: Payer: Self-pay | Admitting: Family Medicine

## 2020-07-09 ENCOUNTER — Ambulatory Visit (INDEPENDENT_AMBULATORY_CARE_PROVIDER_SITE_OTHER): Payer: 59

## 2020-07-09 ENCOUNTER — Other Ambulatory Visit: Payer: Self-pay

## 2020-07-09 ENCOUNTER — Ambulatory Visit: Payer: 59 | Admitting: Family Medicine

## 2020-07-09 VITALS — BP 110/84 | Ht 61.0 in

## 2020-07-09 DIAGNOSIS — M999 Biomechanical lesion, unspecified: Secondary | ICD-10-CM

## 2020-07-09 DIAGNOSIS — M1612 Unilateral primary osteoarthritis, left hip: Secondary | ICD-10-CM

## 2020-07-09 DIAGNOSIS — M25552 Pain in left hip: Secondary | ICD-10-CM

## 2020-07-09 NOTE — Progress Notes (Signed)
[place] Phone: [phone] Subjective:   Andrea Andrea, am serving as a scribe for Dr. Hulan Saas. This visit occurred during the SARS-CoV-2 public health emergency.  Safety protocols were in place, including screening questions prior to the visit, additional usage of staff PPE, and extensive cleaning of exam room while observing appropriate contact time as indicated for disinfecting solutions.   I'm seeing this patient by the request  of:  Patient, Andrea Andrea  CC: Left hip pain follow-up  [id number]  Andrea Andrea is a 49 y.o. female coming in with complaint of back and neck pain. OMT 05/28/2020. Patient states that in early morning her left hip is stiff. Pain will go away mid day but then by the evening her pain worsens and she begins having antalgic gait. Pain over hip flexor. Played basketball and threw a football and was in a lot of pain. Using meloxicam 7.5mg  every other day.  Patient does not want to take it regularly if she does not have to.  Patient states that unfortunately her quality of life seems to be worsening.  Known arthritic changes of the hips previously.  Medications patient has been prescribed: Meloxicam, Vit D  Taking:         Reviewed prior external information including notes and imaging from previsou exam, outside providers and external EMR if available.   As well as notes that were available from care everywhere and other healthcare systems.  Past medical history, social, surgical and family history all reviewed in electronic medical record.  Andrea pertanent information unless stated regarding to the chief complaint.   Past Medical History:  Diagnosis Date  . 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 Arthritis of left hip    saw Dr. [name], had MRI  . Headache   . Osteopenia     Andrea Known Allergies   Review of Systems:  Andrea headache, visual changes, nausea, vomiting,  diarrhea, constipation, dizziness, abdominal pain, skin rash, fevers, chills, night sweats, weight loss, swollen lymph nodes, body aches, joint swelling, chest pain, shortness of breath, mood changes. POSITIVE muscle aches  Objective  Blood pressure 110/84, height 5\' 1"  (1.549 m).   General: Andrea apparent distress alert and oriented x3 mood and affect normal, dressed appropriately.  HEENT: Pupils equal, extraocular movements intact  Respiratory: Patient's speak in full sentences and does not appear short of breath  Cardiovascular: Andrea lower extremity edema, non tender, Andrea erythema  Neuro: Cranial nerves II through XII are intact, neurovascularly intact in all extremities with 2+ DTRs and 2+ pulses.  Gait normal with good balance and coordination.  MSK: Patient does have good muscle imbalance overall.  Patient the left hip has actually decreasing internal range of motion from patient's previous exam.  Patient does have pain with grind test noted as well.  Patient does though still have 5 out of 5 strength of the musculature. Back -mild tightness noted in the paraspinal musculature.  Negative straight leg test.  Once again tightness with [name] left greater than right  Osteopathic findings   T9 extended rotated and side bent left L2 flexed rotated and side bent right Sacrum left on left       Assessment and Plan:  Arthritis of left hip Patient has had known arthritic changes in the hips for quite some time.  Seems to be having worsening pain again.  We discussed the potential for repeat PRP but  I think at this point I would like the idea of an MR arthrogram to further evaluate the arthritis versus labral pathology that could be contributing to her pain.  Patient is young and very active.  We would like to hold on any type of replacement but once again quality of life is seems to be slowly worsening.  We discussed icing regimen and home exercises.  We discussed continuing the gabapentin as well  as some of the meloxicam that she is taking.  Follow-up with me again after imaging to discuss further    Nonallopathic problems  Decision today to treat with OMT was based on Physical Exam  After verbal consent patient was treated with HVLA, ME, FPR techniques in thoracic, lumbar, and sacral  areas  Patient tolerated the procedure well with improvement in symptoms  Patient given exercises, stretches and lifestyle modifications  See medications in patient instructions if given  Patient will follow up in 4-8 weeks      The above documentation has been reviewed and is accurate and complete Andrea Pulley, DO       Note: This dictation was prepared with Dragon dictation along with smaller phrase technology. Any transcriptional errors that result from this process are unintentional.

## 2020-07-09 NOTE — Patient Instructions (Addendum)
[facility] [phone] We will be in contact once we have results Follow-up in 6 weeks

## 2020-07-10 ENCOUNTER — Encounter: Payer: Self-pay | Admitting: Family Medicine

## 2020-07-10 NOTE — Assessment & Plan Note (Signed)
Patient has had known arthritic changes in the hips for quite some time.  Seems to be having worsening pain again.  We discussed the potential for repeat PRP but I think at this point I would like the idea of an MR arthrogram to further evaluate the arthritis versus labral pathology that could be contributing to her pain.  Patient is young and very active.  We would like to hold on any type of replacement but once again quality of life is seems to be slowly worsening.  We discussed icing regimen and home exercises.  We discussed continuing the gabapentin as well as some of the meloxicam that she is taking.  Follow-up with me again after imaging to discuss further

## 2020-07-13 ENCOUNTER — Ambulatory Visit: Payer: 59

## 2020-07-22 ENCOUNTER — Other Ambulatory Visit: Payer: Self-pay | Admitting: Family Medicine

## 2020-07-22 ENCOUNTER — Ambulatory Visit (INDEPENDENT_AMBULATORY_CARE_PROVIDER_SITE_OTHER): Payer: 59 | Admitting: Obstetrics & Gynecology

## 2020-07-22 ENCOUNTER — Other Ambulatory Visit: Payer: Self-pay

## 2020-07-22 ENCOUNTER — Encounter: Payer: Self-pay | Admitting: Obstetrics & Gynecology

## 2020-07-22 VITALS — BP 103/72 | HR 71 | Ht 61.0 in | Wt 136.0 lb

## 2020-07-22 DIAGNOSIS — Z1211 Encounter for screening for malignant neoplasm of colon: Secondary | ICD-10-CM

## 2020-07-22 DIAGNOSIS — Z01419 Encounter for gynecological examination (general) (routine) without abnormal findings: Secondary | ICD-10-CM | POA: Diagnosis not present

## 2020-07-22 DIAGNOSIS — Z9882 Breast implant status: Secondary | ICD-10-CM | POA: Insufficient documentation

## 2020-07-22 DIAGNOSIS — Z8742 Personal history of other diseases of the female genital tract: Secondary | ICD-10-CM | POA: Insufficient documentation

## 2020-07-22 DIAGNOSIS — Z1159 Encounter for screening for other viral diseases: Secondary | ICD-10-CM

## 2020-07-22 DIAGNOSIS — Z800 Family history of malignant neoplasm of digestive organs: Secondary | ICD-10-CM

## 2020-07-22 NOTE — Progress Notes (Signed)
49 y.o. G3P3 Married White or Caucasian female here for annual exam.  Doing well.  Denies vaginal bleeding.  Biggest issue is her left hip.  Seeing Dr. Tamala Julian.  Just did a hip x ray.  She has a hip MRI scheduled next week.  Had labrum tear that is now calcified.  Still following at [facility].  Had appt a few weeks ago and hormone levels were done.  No LMP recorded. Patient has had an ablation.          Sexually active: Yes.    The current method of family planning is tubal ligation.    Exercising: Yes.   Smoker:  no  Health Maintenance: Pap:  09/2018 neg/neg HR HPV History of abnormal Pap:  Yes, LGSIL in her 30's MMG:  09/2016.  Pt aware this is due. Colonoscopy:  Discussed today BMD:   Not indicated TDaP:  2018 Pneumonia vaccine(s):  Not indicated Shingrix:   Not indicated Hep C testing: today Screening Labs: done with [facility]   reports that she has never smoked. She has never used smokeless tobacco. She reports previous alcohol use. She reports that she does not use drugs.  Past Medical History:  Diagnosis Date  . 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 Arthritis of left hip    saw Dr. [name], had MRI  . Headache   . Osteopenia     Past Surgical History:  Procedure Laterality Date  . BREAST ENHANCEMENT SURGERY  4/00  . BREAST SURGERY  6/08   revision on right breast due to tight muscles  . CESAREAN SECTION    . HTA     and BTL  . OTHER SURGICAL HISTORY  10/2013   Vaginal rejuvination surgery, Dr. [name], [place]    Current Outpatient Medications  Medication Sig Dispense Refill  . Calcium Carbonate (CALCIUM 600 PO) Take by mouth daily.    [name] EPINEPHrine (AUVI-Q) 0.3 mg/0.3 mL IJ SOAJ injection See admin instructions.    . gabapentin (NEURONTIN) 100 MG capsule TAKE 2 CAPSULES BY MOUTH AT BEDTIME 180 capsule 0  . levocetirizine (XYZAL) 5 MG tablet     . meloxicam (MOBIC) 7.5 MG tablet Take 1 tablet by mouth once daily 30 tablet 0  .  mometasone (ELOCON) 0.1 % cream Apply to the affected area twice a day for up to 5 days as needed for itching    . Multiple Vitamins-Minerals (MULTIVITAMIN PO) Take by mouth daily.    . NON FORMULARY Ashwaganda    . NONFORMULARY OR COMPOUNDED ITEM Testosterone 2% cream  1  . Omega-3 Fatty Acids (FISH OIL PO) Take by mouth.    . Probiotic Product (PROBIOTIC PO) Take by mouth.    . progesterone (PROMETRIUM) 100 MG capsule     . tretinoin (RETIN-A) 0.05 % cream APPLY CREAM TOPICALLY ONCE DAILY  5  . Vitamin D, Ergocalciferol, (DRISDOL) 1.25 MG (50000 UNIT) CAPS capsule Take 1 capsule by mouth once a week 12 capsule 0  . B Complex Vitamins (B COMPLEX PO) Take by mouth. (Patient not taking: Reported on 07/22/2020)    . Coenzyme Q10 (CO Q 10 PO) Take by mouth. (Patient not taking: Reported on 07/22/2020)    . meloxicam (MOBIC) 15 MG tablet Take 1 tablet (15 mg total) by mouth daily. (Patient not taking: Reported on 07/22/2020) 90 tablet 1  . metaxalone (SKELAXIN) 800 MG tablet Take 1 tablet (800 mg total) by mouth 3 (three) times daily. (Patient not  taking: Reported on 07/22/2020) 90 tablet 3  . NATURE-THROID 32.5 MG tablet Take 32.5 mg by mouth every morning. (Patient not taking: Reported on 07/22/2020)  3  . nitroGLYCERIN (NITRODUR - DOSED IN MG/24 HR) 0.2 mg/hr patch 1/2 patch daily (Patient not taking: Reported on 07/22/2020) 30 patch 1  . zinc gluconate 50 MG tablet Take 50 mg by mouth every other day.     No current facility-administered medications for this visit.    Family History  Problem Relation Age of Onset  . Diabetes Paternal Grandmother   . Hypertension Paternal Grandmother   . Stroke Paternal Grandfather   . Congestive Heart Failure Maternal Grandfather   . Colon polyps Maternal Grandfather   . Colon polyps Mother   . Colon cancer Mother 71    Review of Systems  Exam:   BP 103/72   Pulse 71   Ht 5\' 1"  (1.549 m)   Wt 136 lb (61.7 kg)   BMI 25.70 kg/m   Height: 5\' 1"   (154.9 cm)  General appearance: alert, cooperative and appears stated age Head: Normocephalic, without obvious abnormality, atraumatic Neck: no adenopathy, supple, symmetrical, trachea midline and thyroid normal to inspection and palpation Lungs: clear to auscultation bilaterally Breasts: normal appearance, no masses or tenderness with bilateral implants present Heart: regular rate and rhythm Abdomen: soft, non-tender; bowel sounds normal; no masses,  no organomegaly Extremities: extremities normal, atraumatic, no cyanosis or edema Skin: Skin color, texture, turgor normal. No rashes or lesions Lymph nodes: Cervical, supraclavicular, and axillary nodes normal. No abnormal inguinal nodes palpated Neurologic: Grossly normal   Pelvic: External genitalia:  no lesions              Urethra:  normal appearing urethra with no masses, tenderness or lesions              Bartholins and Skenes: normal                 Vagina: normal appearing vagina with normal color and discharge, no lesions              Cervix: no lesions              Pap taken: No. Bimanual Exam:  Uterus:  normal size, contour, position, consistency, mobility, non-tender              Adnexa: normal adnexa and no mass, fullness, tenderness               Rectovaginal: Confirms               Anus:  normal sphincter tone, no lesions  Chaperone, [employer], CMA, was present for exam.  Assessment/Plan: 1. Well woman exam with routine gynecological exam - Pap 09/2018 neg with neg HR HPV.   - last MMG 2018.  Pt aware this is overdue.  Encouraged to schedule this year. - Colonoscopy referral placed - tdap 2018 - Screening Labs: done with [facility]  2. Colon cancer screening - Ambulatory referral to Gastroenterology  3. Encounter for hepatitis C screening test for low risk patient - HIV Antibody (routine testing w rflx) - Hepatitis C antibody  4. Family history of colon cancer -colonoscopy referral  placed  5. History of abnormal cervical Pap smear with treatment 1993 - Pap smears have been normal now for 10+ years  6. History of breast implant

## 2020-07-23 LAB — HEPATITIS C ANTIBODY: Hep C Virus Ab: <0.1 s/co ratio (ref 0.0–0.9)

## 2020-07-23 LAB — HIV ANTIBODY (ROUTINE TESTING W REFLEX): HIV Screen 4th Generation wRfx: NONREACTIVE

## 2020-07-27 ENCOUNTER — Ambulatory Visit (INDEPENDENT_AMBULATORY_CARE_PROVIDER_SITE_OTHER): Payer: 59

## 2020-07-27 ENCOUNTER — Ambulatory Visit (INDEPENDENT_AMBULATORY_CARE_PROVIDER_SITE_OTHER): Payer: 59 | Admitting: Sports Medicine

## 2020-07-27 ENCOUNTER — Other Ambulatory Visit: Payer: Self-pay

## 2020-07-27 DIAGNOSIS — M25552 Pain in left hip: Secondary | ICD-10-CM | POA: Diagnosis not present

## 2020-07-27 DIAGNOSIS — M1612 Unilateral primary osteoarthritis, left hip: Secondary | ICD-10-CM

## 2020-07-27 MED ORDER — GADOBENATE DIMEGLUMINE 529 MG/ML IV SOLN
1.0000 mL | Freq: Once | INTRAVENOUS | Status: AC | PRN
  Administered 2020-07-27: 1 mL via INTRAVENOUS

## 2020-07-27 NOTE — Progress Notes (Signed)
    Procedures performed today:    Procedure: Real-time Ultrasound Guided gadolinium contrast injection of left hip joint Device: Samsung HS60  Verbal informed consent obtained.  Time-out conducted.  Noted no overlying erythema, induration, or other signs of local infection.  Skin prepped in a sterile fashion.  Local anesthesia: Topical Ethyl chloride.  With sterile technique and under real time ultrasound guidance: 22-gauge spinal needle advanced to the femoral head/neck junction, contacted bone and then injected 1 cc kenalog 40, 2 cc lidocaine, 2 cc bupivacaine, syringe switched and 0.1 cc gadolinium injected, syringe again switched and 10 cc sterile saline used to fully distend the joint. Joint visualized and capsule seen distending confirming intra-articular placement of contrast material and medication. Completed without difficulty  Advised to call if fevers/chills, erythema, induration, drainage, or persistent bleeding.  Images permanently stored in PACS Impression: Technically successful ultrasound guided gadolinium contrast injection for MR arthrography.  Please see separate MR arthrogram report.   Independent interpretation of notes and tests performed by another provider:   None.  Brief History, Exam, Impression, and Recommendations:    Arthritis of left hip This is a very pleasant 49 year old female, she has known hip osteoarthritis on the left, anterior groin pain, central hip labral tear, she has had PRP into her hip joint without improvement. Has never had a steroid injection. She is sent to me for injection for MR arthrography. Today I performed an MR arthrogram injection with steroid, she will likely get good improvement from the steroid component. Further management per primary treating provider.    ___________________________________________ Gwen Her. Dianah Field, M.D., ABFM., CAQSM. Primary Care and [place]  Instructor of [place] [employer] [facility] [employer]

## 2020-07-27 NOTE — Assessment & Plan Note (Signed)
This is a very pleasant 49 year old female, she has known hip osteoarthritis on the left, anterior groin pain, central hip labral tear, she has had PRP into her hip joint without improvement. Has never had a steroid injection. She is sent to me for injection for MR arthrography. Today I performed an MR arthrogram injection with steroid, she will likely get good improvement from the steroid component. Further management per primary treating provider.

## 2020-07-29 ENCOUNTER — Encounter: Payer: Self-pay | Admitting: Family Medicine

## 2020-08-08 HISTORY — PX: TOTAL HIP ARTHROPLASTY: SHX124

## 2020-08-18 ENCOUNTER — Encounter: Payer: Self-pay | Admitting: Family Medicine

## 2020-08-18 ENCOUNTER — Other Ambulatory Visit: Payer: Self-pay

## 2020-08-18 MED ORDER — VITAMIN D (ERGOCALCIFEROL) 1.25 MG (50000 UNIT) PO CAPS: 50000.0000 [IU] | ORAL_CAPSULE | ORAL | 0 refills | Status: DC

## 2020-08-18 MED ORDER — GABAPENTIN 100 MG PO CAPS: 200.0000 mg | ORAL_CAPSULE | Freq: Every day | ORAL | 0 refills | Status: DC

## 2020-08-20 ENCOUNTER — Ambulatory Visit: Payer: 59 | Admitting: Family Medicine

## 2020-08-20 ENCOUNTER — Encounter: Payer: Self-pay | Admitting: Family Medicine

## 2020-08-20 ENCOUNTER — Other Ambulatory Visit: Payer: Self-pay

## 2020-08-20 VITALS — BP 90/64 | HR 57 | Ht 61.0 in | Wt 137.0 lb

## 2020-08-20 DIAGNOSIS — M1612 Unilateral primary osteoarthritis, left hip: Secondary | ICD-10-CM

## 2020-08-20 DIAGNOSIS — M999 Biomechanical lesion, unspecified: Secondary | ICD-10-CM | POA: Diagnosis not present

## 2020-08-20 NOTE — Assessment & Plan Note (Signed)
Patient does have arthritis of the left hip but also had the stress fracture of the acetabulum.  Have patient continue to avoid lower body exercises still for another month that I think will be beneficial for her.  Patient does have the gabapentin at night and the meloxicam for breakthrough.  Has been doing a nearly daily though.  Discussed home exercises and icing regimen.  We discussed that only imaging to improve healing would be another MRI will be necessary or not.  Depending on how patient feels we can do the MRI then we will know patient's baseline for the arthritic changes of the hip and if anything such as a replacement will be needed sooner than later.  Patient is in agreement with the plan and will follow-up again 6 weeks

## 2020-08-20 NOTE — Patient Instructions (Signed)
Good to see you Continue doing nothing ;) See me again in 5-6 weeks

## 2020-08-20 NOTE — Progress Notes (Signed)
Andrea Andrea [name] Phone: [phone] Subjective:   Andrea Andrea [name], am serving as a scribe for Andrea Andrea [name]. This visit occurred during the SARS-CoV-2 public health emergency.  Safety protocols were in place, including screening questions prior to the visit, additional usage of staff PPE, and extensive cleaning of exam room while observing appropriate contact time as indicated for disinfecting solutions.   I'm seeing this patient by the request  of:  Patient, Andrea [name] Pcp Per  CC: Hip and back pain follow-up  [id number]    Andrea Andrea [name] is a 50 y.o. female coming in with complaint of hip and back pain. OMT 07/09/2020.  Patient is following up for the hip and back pain.  Has responded to OMT except for the hip.  Patient states that the hip is severe at this time.  May be some mild improvement since the MRI.    Patient did have an MRI of the hip. MRI did show that there was a potential nondisplaced incomplete stress reaction of the superior acetabulum and iliac wing as well as moderate femoral acetabular osteoarthritic changes with a labral tear and degeneration.  Past Medical History:  Diagnosis Date  . 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 Arthritis of left hip    saw Dr. Lynann Andrea [name], had MRI  . Headache   . Osteopenia    Past Surgical History:  Procedure Laterality Date  . BREAST ENHANCEMENT SURGERY  4/00  . BREAST SURGERY  6/08   revision on right breast due to tight muscles  . CESAREAN SECTION    . HTA     and BTL  . OTHER SURGICAL HISTORY  10/2013   Vaginal rejuvination surgery, Dr. Kathreen [name], [place]   Social History   Socioeconomic History  . Marital status: Married    Spouse name: Not on file  . Number of children: Not on file  . Years of education: Not on file  . Highest education level: Not on file  Occupational History  . Not on file  Tobacco Use  . Smoking status: Never Smoker  . Smokeless  tobacco: Never Used  Vaping Use  . Vaping Use: Never used  Substance and Sexual Activity  . Alcohol use: Not Currently  . Drug use: Andrea [name]  . Sexual activity: Yes    Partners: Male    Birth control/protection: Surgical    Comment: BTL  Other Topics Concern  . Not on file  Social History Narrative  . Not on file   Social Determinants of Health   Financial Resource Strain: Not on file  Food Insecurity: Not on file  Transportation Needs: Not on file  Physical Activity: Not on file  Stress: Not on file  Social Connections: Not on file   Andrea [name] Known Allergies Family History  Problem Relation Age of Onset  . Diabetes Paternal Grandmother   . Hypertension Paternal Grandmother   . Stroke Paternal Grandfather   . Congestive Heart Failure Maternal Grandfather   . Colon polyps Maternal Grandfather   . Colon polyps Mother   . Colon cancer Mother 14    Current Outpatient Medications (Endocrine & Metabolic):  [name]  NATURE-THROID 32.5 MG tablet, Take 32.5 mg by mouth every morning. .  progesterone (PROMETRIUM) 100 MG capsule,   Current Outpatient Medications (Cardiovascular):  [name]  EPINEPHrine 0.3 mg/0.3 mL IJ SOAJ injection, See admin instructions. .  nitroGLYCERIN (NITRODUR - DOSED IN MG/24 HR) 0.2 mg/hr patch,  1/2 patch daily  Current Outpatient Medications (Respiratory):  .  levocetirizine (XYZAL) 5 MG tablet,   Current Outpatient Medications (Analgesics):  .  meloxicam (MOBIC) 15 MG tablet, Take 1 tablet (15 mg total) by mouth daily. .  meloxicam (MOBIC) 7.5 MG tablet, Take 1 tablet by mouth once daily   Current Outpatient Medications (Other):  [name]  B Complex Vitamins (B COMPLEX PO), Take by mouth. .  Calcium Carbonate (CALCIUM 600 PO), Take by mouth daily. .  Coenzyme Q10 (CO Q 10 PO), Take by mouth. .  gabapentin (NEURONTIN) 100 MG capsule, Take 2 capsules (200 mg total) by mouth at bedtime. .  metaxalone (SKELAXIN) 800 MG tablet, Take 1 tablet (800 mg total) by mouth 3 (three)  times daily. .  mometasone (ELOCON) 0.1 % cream, Apply to the affected area twice a day for up to 5 days as needed for itching .  Multiple Vitamins-Minerals (MULTIVITAMIN PO), Take by mouth daily. .  NON FORMULARY, Ashwaganda .  NONFORMULARY OR COMPOUNDED ITEM, Testosterone 2% cream .  Omega-3 Fatty Acids (FISH OIL PO), Take by mouth. .  Probiotic Product (PROBIOTIC PO), Take by mouth. .  tretinoin (RETIN-A) 0.05 % cream, APPLY CREAM TOPICALLY ONCE DAILY .  Vitamin D, Ergocalciferol, (DRISDOL) 1.25 MG (50000 UNIT) CAPS capsule, Take 1 capsule (50,000 Units total) by mouth once a week. .  zinc gluconate 50 MG tablet, Take 50 mg by mouth every other day.   Reviewed prior external information including notes and imaging from  primary care provider As well as notes that were available from care everywhere and other healthcare systems.  Past medical history, social, surgical and family history all reviewed in electronic medical record.  Andrea [name] pertanent information unless stated regarding to the chief complaint.   Review of Systems:  Andrea [name], visual changes, nausea, vomiting, diarrhea, constipation, dizziness, abdominal pain, skin rash, fevers, chills, night sweats, weight loss, swollen lymph nodes, body aches, joint swelling, chest pain, shortness of breath, mood changes. POSITIVE muscle aches  Objective  Blood pressure 90/64, pulse (!) 57, height 5\' 1"  (1.549 m), weight 137 lb (62.1 kg), SpO2 97 %.   General: Andrea [name] apparent distress alert and oriented x3 mood and affect normal, dressed appropriately.  HEENT: Pupils equal, extraocular movements intact  Respiratory: Patient's speak in full sentences and does not appear short of breath  Gait normal with good balance and coordination.  MSK: Patient's low back does have tight as noted.  Left hip does show that patient does have some to palpation with any range of motion still.  Did not do a grind test.  Does have some limited internal range of  motion of 10 degrees. Neurovascularly intact distally.  5 out of 5 strength to lower extremities noted.  Osteopathic findings T8 extended rotated and side bent right L1 flexed rotated and side bent left Sacrum left on left    Impression and Recommendations:     The above documentation has been reviewed and is accurate and complete Lyndal Pulley, DO

## 2020-08-20 NOTE — Assessment & Plan Note (Signed)

## 2020-08-25 MED ORDER — MELOXICAM 7.5 MG PO TABS
7.5000 mg | ORAL_TABLET | Freq: Every day | ORAL | 1 refills | Status: DC
Start: 2020-08-25 — End: 2021-05-21

## 2020-09-04 ENCOUNTER — Encounter: Payer: Self-pay | Admitting: Family Medicine

## 2020-09-07 ENCOUNTER — Other Ambulatory Visit: Payer: Self-pay | Admitting: *Deleted

## 2020-09-07 DIAGNOSIS — M1612 Unilateral primary osteoarthritis, left hip: Secondary | ICD-10-CM

## 2020-09-23 NOTE — Progress Notes (Signed)
[place] Phone: [phone] Subjective:   Andrea Andrea [name], am serving as a scribe for Dr. Hulan Saas. This visit occurred during the SARS-CoV-2 public health emergency.  Safety protocols were in place, including screening questions prior to the visit, additional usage of staff PPE, and extensive cleaning of exam room while observing appropriate contact time as indicated for disinfecting solutions.   I'm seeing this patient by the request  of:  Patient, Andrea [name] Pcp Per  CC: left hip pain follow up   [id number]  Andrea Andrea [name] is a 50 y.o. female coming in with Andrea [name] of back and neck pain. OMT 08/22/2020. Patient states that has not been doing lower body exercise for one month. Felt like hip was becoming more unstable with lack of exercise. Pain in groin and in back continued even though she was not working out. Did bump up Meloxicam to 15mg . Has also tried ice before bed. Has been doing full body, body weight exercises every other day now and her pain decreased. Tuesday and Wednesday of this week she had to use crutches.   Did speak with Dr. Aretha Parrot this past week and is getting another opinion tomorrow from Dr. Stann Mainland at [facility].  Medications patient has been prescribed: Vit D, Meloxicam  Taking:  MRI of hip 12/21-uggestive of a nondisplaced incomplete probable stress fracture at the superior acetabulum/iliac wing without intra-articular extension.  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   Patient did see a specialist and patient is not a candidate for the labral repair.  Patient is not a candidate for resurfacing otherwise.  Would need a total hip replacement at some point.     Reviewed prior external information including notes and imaging from previsou exam, outside providers and external EMR if available.   As well as notes that  were available from care everywhere and other healthcare systems.  Past medical history, social, surgical and family history all reviewed in electronic medical record.  Andrea Andrea [name].   Past Medical History:  Diagnosis Date  . 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 Arthritis of left hip    saw Dr. [name], had MRI  . Headache   . Osteopenia     Andrea Andrea [name]   Review of Systems:  Andrea Andrea [name]  Objective  Blood pressure 112/80, pulse 69, height 5\' 1"  (1.549 m), weight 136 lb (61.7 kg), SpO2 99 %.   General: Andrea [name] apparent distress alert and oriented x3 mood and affect normal, dressed appropriately.  HEENT: Pupils equal, extraocular movements intact  Respiratory: Patient's speak in full sentences and does not appear short of breath  Cardiovascular: Andrea Andrea [name]  Gait severely antalgic favoring the left hip MSK: Decreased range of motion of the left hip in internal and external range of motion. Low back exam does have tightness noted in the paraspinal musculature.  Moderate to severe.  Patient has mild positive [name] on the right greater than left.  Osteopathic findings   T8 extended rotated and side bent right  L2 flexed rotated and side bent left  Sacrum left on left  Assessment and Plan:  Low back pain Chronic, likely more secondary to an exacerbation of the hip pain.  Patient is going to follow-up with another specialist for the arthritis of the hip to discuss the different treatment options.  Patient has always come to the realization that she will need a hip replacement likely in the near future.  Patient states though  that the posterior aspect of the back pain in the hip pain is improved just more of a tightness because she is ambulating differently secondary to the discomfort and pain in the hip.  Patient is doing doing well with the meloxicam and can take that daily.  Does have the gabapentin to help with nighttime pain.  Follow-up with me again 6 weeks    Nonallopathic problems  Decision today to treat with OMT was based on Physical Exam  After verbal consent patient was treated with HVLA, ME, FPR techniques in  thoracic, lumbar, and sacral  areas  Patient tolerated the procedure well with improvement in symptoms  Patient given exercises, stretches and lifestyle modifications  See medications in patient instructions if given  Patient will follow up in 4-8 weeks      The above documentation has been reviewed and is accurate and complete Lyndal Pulley, DO       Note: This dictation was prepared with Dragon dictation along with smaller phrase technology. Any transcriptional errors that result from this process are unintentional.

## 2020-09-24 ENCOUNTER — Ambulatory Visit: Payer: 59 | Admitting: Family Medicine

## 2020-09-24 ENCOUNTER — Encounter: Payer: Self-pay | Admitting: Family Medicine

## 2020-09-24 ENCOUNTER — Other Ambulatory Visit: Payer: Self-pay

## 2020-09-24 VITALS — BP 112/80 | HR 69 | Ht 61.0 in | Wt 136.0 lb

## 2020-09-24 DIAGNOSIS — G8929 Other chronic pain: Secondary | ICD-10-CM

## 2020-09-24 DIAGNOSIS — M1612 Unilateral primary osteoarthritis, left hip: Secondary | ICD-10-CM | POA: Diagnosis not present

## 2020-09-24 DIAGNOSIS — M545 Low back pain, unspecified: Secondary | ICD-10-CM

## 2020-09-24 DIAGNOSIS — M999 Biomechanical lesion, unspecified: Secondary | ICD-10-CM

## 2020-09-24 NOTE — Patient Instructions (Signed)
Good to see you See what Dr. Stann Mainland is going to say [facility] to use Meloxicam daily Continue vitamin D stop K2 See me again in 6 weeks

## 2020-09-24 NOTE — Assessment & Plan Note (Signed)
Chronic, likely more secondary to an exacerbation of the hip pain.  Patient is going to follow-up with another specialist for the arthritis of the hip to discuss the different treatment options.  Patient has always come to the realization that she will need a hip replacement likely in the near future.  Patient states though that the posterior aspect of the back pain in the hip pain is improved just more of a tightness because she is ambulating differently secondary to the discomfort and pain in the hip.  Patient is doing doing well with the meloxicam and can take that daily.  Does have the gabapentin to help with nighttime pain.  Follow-up with me again 6 weeks

## 2020-09-25 ENCOUNTER — Encounter: Payer: Self-pay | Admitting: Family Medicine

## 2020-10-09 LAB — COMPREHENSIVE METABOLIC PANEL
Albumin: 4.6 (ref 3.5–5.0)
Calcium: 9.4 (ref 8.7–10.7)
Globulin: 2.4

## 2020-10-09 LAB — BASIC METABOLIC PANEL
BUN: 16 (ref 4–21)
CO2: 24 — AB (ref 13–22)
Chloride: 103 (ref 99–108)
Creatinine: 0.9 (ref 0.5–1.1)
Glucose: 86
Potassium: 4.2 (ref 3.4–5.3)
Sodium: 140 (ref 137–147)

## 2020-10-09 LAB — TESTOSTERONE: Testosterone: 162.6

## 2020-10-09 LAB — HEPATIC FUNCTION PANEL
ALT: 14 (ref 7–35)
AST: 15 (ref 13–35)
Alkaline Phosphatase: 72 (ref 25–125)
Bilirubin, Total: 0.3

## 2020-10-09 LAB — TSH: TSH: 0.73 (ref 0.41–5.90)

## 2020-10-09 LAB — VITAMIN B12: Vitamin B-12: >2000

## 2020-10-09 LAB — CBC AND DIFFERENTIAL
HCT: 39 (ref 36–46)
Hemoglobin: 12.9 (ref 12.0–16.0)
Neutrophils Absolute: 2.7
Platelets: 275 (ref 150–399)
WBC: 5.5

## 2020-10-09 LAB — VITAMIN D 25 HYDROXY (VIT D DEFICIENCY, FRACTURES): Vit D, 25-Hydroxy: 60.2

## 2020-10-09 LAB — HEMOGLOBIN A1C: Hemoglobin A1C: 5.4

## 2020-10-09 LAB — CBC: RBC: 4.26 (ref 3.87–5.11)

## 2020-10-21 ENCOUNTER — Encounter: Payer: 59 | Admitting: Gastroenterology

## 2020-10-21 ENCOUNTER — Ambulatory Visit: Payer: 59 | Admitting: Family Medicine

## 2020-10-21 ENCOUNTER — Other Ambulatory Visit: Payer: Self-pay

## 2020-10-21 ENCOUNTER — Encounter: Payer: Self-pay | Admitting: Family Medicine

## 2020-10-21 DIAGNOSIS — E038 Other specified hypothyroidism: Secondary | ICD-10-CM

## 2020-10-21 DIAGNOSIS — E039 Hypothyroidism, unspecified: Secondary | ICD-10-CM | POA: Insufficient documentation

## 2020-10-21 DIAGNOSIS — Z01818 Encounter for other preprocedural examination: Secondary | ICD-10-CM | POA: Insufficient documentation

## 2020-10-21 NOTE — Progress Notes (Signed)
Andrea [name] - 50 y.o. female MRN [id number]  Date of birth: September 05, 1970  Subjective No chief complaint on file.   HPI Andrea [name] is a 50 y.o. female here today for initial visit to establish care.  She is planning on having total hip arthroplasty with Dr. Alvan Dame in April and needs surgical clearance.  She has been in pretty good health otherwise.  She sees [facility] for management of hypothyroidism.  She also sees Dr. Charlann Boxer for OMT and sports medicine needs.    She has had sinus surgery in the past and denies any significant complications with anesthesia.  She is not on any blood thinners or aspirin.   She denies any cardiac or pulmonary problems.  She exercises regularly and denies any issues with exercise tolerance other than hip pain.    ROS:  A comprehensive ROS was completed and negative except as noted per HPI  No Known Allergies  Past Medical History:  Diagnosis Date  . 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 Arthritis of left hip    saw Dr. [name], had MRI  . Headache   . Osteopenia     Past Surgical History:  Procedure Laterality Date  . BREAST ENHANCEMENT SURGERY  4/00  . BREAST SURGERY  6/08   revision on right breast due to tight muscles  . CESAREAN SECTION    . HTA     and BTL  . OTHER SURGICAL HISTORY  10/2013   Vaginal rejuvination surgery, Dr. [name], [place]    Social History   Socioeconomic History  . Marital status: Married    Spouse name: Not on file  . Number of children: Not on file  . Years of education: Not on file  . Highest education level: Not on file  Occupational History  . Not on file  Tobacco Use  . Smoking status: Never Smoker  . Smokeless tobacco: Never Used  Vaping Use  . Vaping Use: Never used  Substance and Sexual Activity  . Alcohol use: Not Currently  . Drug use: No  . Sexual activity: Yes    Partners: Male    Birth control/protection: Surgical    Comment: BTL  Other Topics Concern  . Not on  file  Social History Narrative  . Not on file   Social Determinants of Health   Financial Resource Strain: Not on file  Food Insecurity: Not on file  Transportation Needs: Not on file  Physical Activity: Not on file  Stress: Not on file  Social Connections: Not on file    Family History  Problem Relation Age of Onset  . Diabetes Paternal Grandmother   . Hypertension Paternal Grandmother   . Stroke Paternal Grandfather   . Congestive Heart Failure Maternal Grandfather   . Colon polyps Maternal Grandfather   . Colon polyps Mother   . Colon cancer Mother 78    Health Maintenance  Topic Date Due  . COLONOSCOPY (Pts 45-60yrs Insurance coverage will need to be confirmed)  Never done  . INFLUENZA VACCINE  Never done  . PAP SMEAR-Modifier  09/21/2021  . TETANUS/TDAP  07/14/2027  . Hepatitis C Screening  Completed  . HIV Screening  Completed  . HPV VACCINES  Aged Out     ----------------------------------------------------------------------------------------------------------------------------------------------------------------------------------------------------------------- Physical Exam There were no vitals taken for this visit.  Physical Exam Constitutional:      General: She is not in acute distress.    Appearance: Normal appearance.  HENT:  Head: Normocephalic and atraumatic.     Right Ear: Tympanic membrane normal.     Left Ear: Tympanic membrane normal.     Nose: Nose normal.     Mouth/Throat:     Mouth: Mucous membranes are moist.  Eyes:     General: No scleral icterus.    Conjunctiva/sclera: Conjunctivae normal.  Neck:     Thyroid: No thyromegaly.  Cardiovascular:     Rate and Rhythm: Normal rate and regular rhythm.     Heart sounds: Normal heart sounds.  Pulmonary:     Effort: Pulmonary effort is normal.     Breath sounds: Normal breath sounds.  Abdominal:     General: Bowel sounds are normal. There is no distension.     Palpations: Abdomen is  soft.     Tenderness: There is no abdominal tenderness. There is no guarding.  Musculoskeletal:        General: Normal range of motion.     Cervical back: Normal range of motion and neck supple.  Lymphadenopathy:     Cervical: No cervical adenopathy.  Skin:    General: Skin is warm and dry.     Findings: No rash.  Neurological:     General: No focal deficit present.     Mental Status: She is alert and oriented to person, place, and time.     Cranial Nerves: No cranial nerve deficit.     Coordination: Coordination normal.  Psychiatric:        Mood and Affect: Mood normal.        Behavior: Behavior normal.     ------------------------------------------------------------------------------------------------------------------------------------------------------------------------------------------------------------------- Assessment and Plan  Hypothyroid She sees [facility] for this.  Recent labs WNL.    Pre-op evaluation Recent labs reviewed from [facility].  Normal H&H and no renal, hepatic or electrolyte abnormalities noted.  She is low risk for cardiac complications and I think she can proceed as planned with elective hip arthroplasty.     No orders of the defined types were placed in this encounter.   No follow-ups on file.    This visit occurred during the SARS-CoV-2 public health emergency.  Safety protocols were in place, including screening questions prior to the visit, additional usage of staff PPE, and extensive cleaning of exam room while observing appropriate contact time as indicated for disinfecting solutions.

## 2020-10-21 NOTE — Assessment & Plan Note (Signed)
Recent labs reviewed from [facility].  Normal H&H and no renal, hepatic or electrolyte abnormalities noted.  She is low risk for cardiac complications and I think she can proceed as planned with elective hip arthroplasty.

## 2020-10-21 NOTE — Addendum Note (Signed)
Addended by: Peggye Ley on: 10/21/2020 01:35 PM   Modules accepted: Orders

## 2020-10-21 NOTE — Patient Instructions (Signed)
Great to meet you today! Hope your surgery goes well!

## 2020-10-21 NOTE — Assessment & Plan Note (Signed)
She sees [facility] for this.  Recent labs WNL.

## 2020-10-28 ENCOUNTER — Encounter: Payer: Self-pay | Admitting: Family Medicine

## 2020-10-30 ENCOUNTER — Other Ambulatory Visit: Payer: Self-pay | Admitting: Family Medicine

## 2020-11-02 ENCOUNTER — Other Ambulatory Visit: Payer: Self-pay | Admitting: Family Medicine

## 2020-11-04 ENCOUNTER — Encounter: Payer: Self-pay | Admitting: Family Medicine

## 2020-11-04 NOTE — Progress Notes (Signed)
[place] [address] [place] Phone: [phone] Subjective:   I Andrea [name] am serving as a Education administrator for Dr. Hulan Saas.  This visit occurred during the SARS-CoV-2 public health emergency.  Safety protocols were in place, including screening questions prior to the visit, additional usage of staff PPE, and extensive cleaning of exam room while observing appropriate contact time as indicated for disinfecting solutions.   I'm seeing this patient by the request  of:  Luetta Nutting, DO  CC: Back and neck pain follow-up  [id number]  Andrea [name] is a 50 y.o. female coming in with complaint of back and neck pain. OMT 09/24/2020. Patient states she is not doing well but surgery is Monday.  Patient is going to have a left-sided total hip replacement.  No longer taking the meloxicam secondary to the surgery and states that unfortunately that has caused more pain at the moment.  Medications patient has been prescribed: Meloxicam, Vit D         Reviewed prior external information including notes and imaging from previsou exam, outside providers and external EMR if available.   As well as notes that were available from care everywhere and other healthcare systems.  Past medical history, social, surgical and family history all reviewed in electronic medical record.  No pertanent information unless stated regarding to the chief complaint.   Past Medical History:  Diagnosis Date  . 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 Arthritis of left hip    saw Dr. [name], had MRI  . Headache   . Osteopenia     Allergies  Allergen Reactions  . Vicks Nyquil Cold & Flu Night [Dm-Apap-Cpm] Other (See Comments)    Heavy, tight chest     Review of Systems:  No headache, visual changes, nausea, vomiting, diarrhea, constipation, dizziness, abdominal pain, skin rash, fevers, chills, night sweats, weight loss, swollen lymph nodes, body aches, joint  swelling, chest pain, shortness of breath, mood changes. POSITIVE muscle aches  Objective  Blood pressure 110/78, pulse 71, height 5' (1.524 m), weight 137 lb (62.1 kg), SpO2 98 %.   General: No apparent distress alert and oriented x3 mood and affect normal, dressed appropriately.  HEENT: Pupils equal, extraocular movements intact  Respiratory: Patient's speak in full sentences and does not appear short of breath  Cardiovascular: No lower extremity edema, non tender, no erythema  Neuro: Cranial nerves II through XII are intact, neurovascularly intact in all extremities with 2+ DTRs and 2+ pulses.  Gait normal with good balance and coordination.  MSK: Continued limited range of motion with internal range of motion of the hip. Low back mild tightness noted.  Some pain in the thoracolumbar juncture also noted.  Seems to be right greater than left in both areas.  Osteopathic findings  T8 extended rotated and side bent left L2 flexed rotated and side bent right Sacrum right on right       Assessment and Plan:  Low back pain Patient does have the low back pain.  Patient is going to be getting the hip replacement in the near future.  We discussed with patient that we can consider the possibility of repeating manipulation again 6 weeks after surgery.  Discussed icing regimen and home exercises, discussed avoiding certain activities.  Follow-up with me again 6 to 8 weeks    Nonallopathic problems  Decision today to treat with OMT was based on Physical Exam  After verbal consent patient  was treated with HVLA, ME, FPR techniques in  thoracic, lumbar, and sacral  areas  Patient tolerated the procedure well with improvement in symptoms  Patient given exercises, stretches and lifestyle modifications  See medications in patient instructions if given  Patient will follow up in 4-8 weeks      The above documentation has been reviewed and is accurate and complete Lyndal Pulley,  DO       Note: This dictation was prepared with Dragon dictation along with smaller phrase technology. Any transcriptional errors that result from this process are unintentional.

## 2020-11-05 ENCOUNTER — Ambulatory Visit: Payer: 59 | Admitting: Family Medicine

## 2020-11-05 ENCOUNTER — Other Ambulatory Visit: Payer: Self-pay

## 2020-11-05 ENCOUNTER — Encounter: Payer: Self-pay | Admitting: Family Medicine

## 2020-11-05 VITALS — BP 110/78 | HR 71 | Ht 60.0 in | Wt 137.0 lb

## 2020-11-05 DIAGNOSIS — M999 Biomechanical lesion, unspecified: Secondary | ICD-10-CM | POA: Diagnosis not present

## 2020-11-05 DIAGNOSIS — M545 Low back pain, unspecified: Secondary | ICD-10-CM | POA: Diagnosis not present

## 2020-11-05 DIAGNOSIS — G8929 Other chronic pain: Secondary | ICD-10-CM

## 2020-11-05 NOTE — Patient Instructions (Addendum)
Good to see you You will do wonderful See me again in 6-8 weeks after surgery

## 2020-11-05 NOTE — Assessment & Plan Note (Signed)
Patient does have the low back pain.  Patient is going to be getting the hip replacement in the near future.  We discussed with patient that we can consider the possibility of repeating manipulation again 6 weeks after surgery.  Discussed icing regimen and home exercises, discussed avoiding certain activities.  Follow-up with me again 6 to 8 weeks

## 2020-12-30 NOTE — Progress Notes (Signed)
[place] [address] [place] Phone: [phone] Subjective:   I Andrea [name] am serving as a Education administrator for Dr. Hulan Saas.  This visit occurred during the SARS-CoV-2 public health emergency.  Safety protocols were in place, including screening questions prior to the visit, additional usage of staff PPE, and extensive cleaning of exam room while observing appropriate contact time as indicated for disinfecting solutions.   I'm seeing this patient by the request  of:  Luetta Nutting, DO  CC: back and neck pain   [id number]  Andrea [name] is a 50 y.o. female coming in with complaint of back and neck pain. OMT 11/05/2020. Patient states she feels better today. Muscle spasm in the back today. States the hip is fantastic.   Medications patient has been prescribed: Gabapentin, Meloxicam  Taking: Intermittently         Reviewed prior external information including notes and imaging from previsou exam, outside providers and external EMR if available.   As well as notes that were available from care everywhere and other healthcare systems.  Past medical history, social, surgical and family history all reviewed in electronic medical record.  No pertanent information unless stated regarding to the chief complaint.   Past Medical History:  Diagnosis Date  . 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 Arthritis of left hip    saw Dr. [name], had MRI  . Headache   . Osteopenia     Allergies  Allergen Reactions  . Vicks Nyquil Cold & Flu Night [Dm-Apap-Cpm] Other (See Comments)    Heavy, tight chest     Review of Systems:  No headache, visual changes, nausea, vomiting, diarrhea, constipation, dizziness, abdominal pain, skin rash, fevers, chills, night sweats, weight loss, swollen lymph nodes, body aches, joint swelling, chest pain, shortness of breath, mood changes. POSITIVE muscle aches  Objective  Blood pressure 100/80, pulse 64,  height 5' (1.524 m), weight 135 lb (61.2 kg), SpO2 98 %.   General: No apparent distress alert and oriented x3 mood and affect normal, dressed appropriately.  HEENT: Pupils equal, extraocular movements intact  Respiratory: Patient's speak in full sentences and does not appear short of breath  Cardiovascular: No lower extremity edema, non tender, no erythema  Patient does have very mild atrophy noted on the gluteal area as well as the quadriceps bilaterally.  Patient does have tightness in the lower back.  Significantly more on the left side the patient does have some mild inflammation and seems on the paraspinal musculature of the lumbar spine on the right side.  Osteopathic findings  T9 extended rotated and side bent left L2 flexed rotated and side bent right Sacrum left on       Assessment and Plan:  Low back pain Chronic problem with mild exacerbation with patient not being able to work out as regularly.  Discussed using the meloxicam for the next 3 days.  Discussed home exercises and icing regimen.  Discussed starting to increase activity since after the hip replacement.  Follow-up with me again in 4 to 6 weeks  Arthritis of left hip Patient responded very well to the replacement at this time. Started to increase activity more.  Discussed asymmetric training that I think will be beneficial.  Follow-up again in 6 to 8 weeks    Nonallopathic problems  Decision today to treat with OMT was based on Physical Exam  After verbal consent patient was treated with HVLA, ME, FPR techniques  in  thoracic, lumbar, and sacral  areas  Patient tolerated the procedure well with improvement in symptoms  Patient given exercises, stretches and lifestyle modifications  See medications in patient instructions if given  Patient will follow up in 4-6 weeks      The above documentation has been reviewed and is accurate and complete Lyndal Pulley, DO       Note: This dictation was  prepared with Dragon dictation along with smaller phrase technology. Any transcriptional errors that result from this process are unintentional.

## 2020-12-31 ENCOUNTER — Ambulatory Visit (INDEPENDENT_AMBULATORY_CARE_PROVIDER_SITE_OTHER): Payer: 59 | Admitting: Family Medicine

## 2020-12-31 ENCOUNTER — Encounter: Payer: Self-pay | Admitting: Family Medicine

## 2020-12-31 ENCOUNTER — Other Ambulatory Visit: Payer: Self-pay

## 2020-12-31 VITALS — BP 100/80 | HR 64 | Ht 60.0 in | Wt 135.0 lb

## 2020-12-31 DIAGNOSIS — M9904 Segmental and somatic dysfunction of sacral region: Secondary | ICD-10-CM | POA: Diagnosis not present

## 2020-12-31 DIAGNOSIS — M545 Low back pain, unspecified: Secondary | ICD-10-CM

## 2020-12-31 DIAGNOSIS — M1612 Unilateral primary osteoarthritis, left hip: Secondary | ICD-10-CM

## 2020-12-31 DIAGNOSIS — M9902 Segmental and somatic dysfunction of thoracic region: Secondary | ICD-10-CM | POA: Diagnosis not present

## 2020-12-31 DIAGNOSIS — M9903 Segmental and somatic dysfunction of lumbar region: Secondary | ICD-10-CM

## 2020-12-31 DIAGNOSIS — G8929 Other chronic pain: Secondary | ICD-10-CM

## 2020-12-31 NOTE — Assessment & Plan Note (Signed)
Patient responded very well to the replacement at this time. Started to increase activity more.  Discussed asymmetric training that I think will be beneficial.  Follow-up again in 6 to 8 weeks

## 2020-12-31 NOTE — Patient Instructions (Signed)
Good to see you Get back on the horse Consider starting with asymmetric training Take the moloxicam for 10 days See me again in 6-8 weeks

## 2020-12-31 NOTE — Assessment & Plan Note (Signed)
Chronic problem with mild exacerbation with patient not being able to work out as regularly.  Discussed using the meloxicam for the next 3 days.  Discussed home exercises and icing regimen.  Discussed starting to increase activity since after the hip replacement.  Follow-up with me again in 4 to 6 weeks

## 2021-02-05 ENCOUNTER — Encounter: Payer: Self-pay | Admitting: Gastroenterology

## 2021-02-11 NOTE — Progress Notes (Signed)
[facility] [address] [place] Phone: [phone] Subjective:   I [name] am serving as a Education administrator for Dr. Hulan Saas.  This visit occurred during the SARS-CoV-2 public health emergency.  Safety protocols were in place, including screening questions prior to the visit, additional usage of staff PPE, and extensive cleaning of exam room while observing appropriate contact time as indicated for disinfecting solutions.   I'm seeing this patient by the request  of:  Luetta Nutting, DO  CC: Neck pain and hip pain follow-up  [id number]  Andrea [name] is a 50 y.o. female coming in with complaint of back and neck pain. OMT 12/31/2020. Patient states she is doing much better today. Had a massage.  Patient has been able to increase her activity.  Working out regularly.  Riding horses regularly.  Things that she is not able to do before surgery.  Feeling much better at this moment.  Medications patient has been prescribed: Gabapentin  Taking: Intermittently but really not much         Reviewed prior external information including notes and imaging from previsou exam, outside providers and external EMR if available.   As well as notes that were available from care everywhere and other healthcare systems.  Past medical history, social, surgical and family history all reviewed in electronic medical record.  No pertanent information unless stated regarding to the chief complaint.   Past Medical History:  Diagnosis Date   Abnormal Pap smear 2008   LGSIL   Arthritis of left hip    saw Dr. [name], had MRI   Headache    Osteopenia     Allergies  Allergen Reactions   Vicks Nyquil Cold & Flu Night [Dm-Apap-Cpm] Other (See Comments)    Heavy, tight chest     Review of Systems:  No headache, visual changes, nausea, vomiting, diarrhea, constipation, dizziness, abdominal pain, skin rash, fevers, chills, night sweats, weight loss,  swollen lymph nodes, body aches, joint swelling, chest pain, shortness of breath, mood changes.  Objective  Blood pressure 112/70, pulse (!) 57, height 5' (1.524 m), weight 133 lb (60.3 kg), SpO2 99 %.   General: No apparent distress alert and oriented x3 mood and affect normal, dressed appropriately.  HEENT: Pupils equal, extraocular movements intact  Respiratory: Patient's speak in full sentences and does not appear short of breath  Cardiovascular: No lower extremity edema, non tender, no erythema  Patient is doing very well overall.  Patient does have some mild loss of lordosis of the lumbar spine.  Patient does have some mild tightness noted.  Osteopathic findings  T4 extended rotated and side bent left L1 flexed rotated and side bent right Sacrum right on right Pelvic shear noted      Assessment and Plan:  Low back pain Patient has been doing remarkably well at this time.  Increase activity as tolerated.  Patient has made significant progress with her hip replacement.  Follow-up with me again 6 to 8 weeks   Nonallopathic problems  Decision today to treat with OMT was based on Physical Exam  After verbal consent patient was treated with HVLA, ME, FPR techniques in thoracic, lumbar, and sacral  areas  Patient tolerated the procedure well with improvement in symptoms  Patient given exercises, stretches and lifestyle modifications  See medications in patient instructions if given  Patient will follow up in 4-8 weeks     The above documentation has been reviewed and is  accurate and complete Lyndal Pulley, DO        Note: This dictation was prepared with Dragon dictation along with smaller phrase technology. Any transcriptional errors that result from this process are unintentional.

## 2021-02-18 ENCOUNTER — Ambulatory Visit (INDEPENDENT_AMBULATORY_CARE_PROVIDER_SITE_OTHER): Payer: 59 | Admitting: Family Medicine

## 2021-02-18 ENCOUNTER — Other Ambulatory Visit: Payer: Self-pay

## 2021-02-18 ENCOUNTER — Encounter: Payer: Self-pay | Admitting: Family Medicine

## 2021-02-18 VITALS — BP 112/70 | HR 57 | Ht 60.0 in | Wt 133.0 lb

## 2021-02-18 DIAGNOSIS — M9902 Segmental and somatic dysfunction of thoracic region: Secondary | ICD-10-CM | POA: Diagnosis not present

## 2021-02-18 DIAGNOSIS — M9903 Segmental and somatic dysfunction of lumbar region: Secondary | ICD-10-CM

## 2021-02-18 DIAGNOSIS — M9904 Segmental and somatic dysfunction of sacral region: Secondary | ICD-10-CM

## 2021-02-18 DIAGNOSIS — M1612 Unilateral primary osteoarthritis, left hip: Secondary | ICD-10-CM

## 2021-02-18 DIAGNOSIS — G8929 Other chronic pain: Secondary | ICD-10-CM

## 2021-02-18 DIAGNOSIS — M545 Low back pain, unspecified: Secondary | ICD-10-CM | POA: Diagnosis not present

## 2021-02-18 NOTE — Assessment & Plan Note (Signed)
Patient has been doing remarkably well at this time.  Increase activity as tolerated.  Patient has made significant progress with her hip replacement.  Follow-up with me again 6 to 8 weeks

## 2021-02-18 NOTE — Patient Instructions (Addendum)
Good to see you So proud of you thanks for making my day! See me again in 6 weeks

## 2021-02-25 ENCOUNTER — Encounter (HOSPITAL_BASED_OUTPATIENT_CLINIC_OR_DEPARTMENT_OTHER): Payer: Self-pay | Admitting: Obstetrics & Gynecology

## 2021-03-27 ENCOUNTER — Emergency Department (HOSPITAL_BASED_OUTPATIENT_CLINIC_OR_DEPARTMENT_OTHER): Payer: 59

## 2021-03-27 ENCOUNTER — Emergency Department (HOSPITAL_BASED_OUTPATIENT_CLINIC_OR_DEPARTMENT_OTHER)
Admission: EM | Admit: 2021-03-27 | Discharge: 2021-03-27 | Disposition: A | Discharge status: Home / Self Care | Payer: 59 | Attending: Emergency Medicine | Admitting: Emergency Medicine

## 2021-03-27 ENCOUNTER — Encounter (HOSPITAL_BASED_OUTPATIENT_CLINIC_OR_DEPARTMENT_OTHER): Payer: Self-pay | Admitting: Emergency Medicine

## 2021-03-27 ENCOUNTER — Other Ambulatory Visit: Payer: Self-pay

## 2021-03-27 DIAGNOSIS — M79605 Pain in left leg: Secondary | ICD-10-CM

## 2021-03-27 DIAGNOSIS — Z79899 Other long term (current) drug therapy: Secondary | ICD-10-CM | POA: Diagnosis not present

## 2021-03-27 DIAGNOSIS — E039 Hypothyroidism, unspecified: Secondary | ICD-10-CM | POA: Diagnosis not present

## 2021-03-27 DIAGNOSIS — M79662 Pain in left lower leg: Secondary | ICD-10-CM | POA: Insufficient documentation

## 2021-03-27 NOTE — ED Triage Notes (Signed)
Patient with "knot" in LLE. Getting larger on posterior calf area. Increased pain when laying down. No temp or color changes noted.

## 2021-03-27 NOTE — Discharge Instructions (Addendum)
Ultrasound not show any signs of blood clot.  You should continue to wear compression stocking to see if that helps with your pain.  Recommend you take ibuprofen to help with pain and inflammation.  Follow-up with your sports medicine doctor if you continue to have pain.

## 2021-03-27 NOTE — ED Provider Notes (Signed)
[facility] EMERGENCY DEPARTMENT Provider Note   CSN: [id number] Arrival date & time: 03/27/21  1356     History Chief Complaint  Patient presents with   Leg Pain    Andrea [name] is a 50 y.o. female with past medical history as listed below presents to emergency room today with chief complaint of left lower leg pain times 3 days.  She states she feels like she has a knot in her left calf.  She states it has progressively gotten larger.  She states she has pain when she is inactive with laying still.  No pain with ambulation or movement.  She describes the pain as a dull ache.  She denies any pain currently.  She denies any fall, injury or trauma to the leg.  She does admit to recently starting an estrogen patch x1 month ago.  No personal or family history of clotting disorder.  She denies any recent surgery or long period of travel.  Denies any fever, chills, chest pain, shortness of breath, numbness, tingling or weakness.  She has tried to ice the area as well as apply heat without symptom improvement.  She denies any new medications or supplements.   Past Medical History:  Diagnosis Date   Abnormal Pap smear 2008   LGSIL   Arthritis of left hip    saw Dr. [name], had MRI   Headache    Osteopenia     Patient Active Problem List   Diagnosis Date Noted   Hypothyroid 10/21/2020   Pre-op evaluation 10/21/2020   History of breast implant 07/22/2020   History of abnormal cervical Pap smear 07/22/2020   Family history of colon cancer 07/22/2020   Low back pain 09/11/2017   Nonallopathic lesion of sacral region 09/11/2017   Nonallopathic lesion of lumbosacral region 09/11/2017   Nonallopathic lesion of thoracic region 09/11/2017   Arthritis of left hip 09/11/2017   DEPRESSION/ANXIETY 09/15/2008    Past Surgical History:  Procedure Laterality Date   BREAST ENHANCEMENT SURGERY  4/00   BREAST SURGERY  6/08   revision on right breast due to tight muscles   CESAREAN  SECTION     HTA     and BTL   OTHER SURGICAL HISTORY  10/2013   Vaginal rejuvination surgery, Dr. Norton Blizzard, [place], [place]     OB History     Gravida  3   Para  3   Term      Preterm      AB      Living  2      SAB      IAB      Ectopic      Multiple      Live Births  3           Family History  Problem Relation Age of Onset   Diabetes Paternal Grandmother    Hypertension Paternal Grandmother    Stroke Paternal Grandfather    Congestive Heart Failure Maternal Grandfather    Colon polyps Maternal Grandfather    Colon polyps Mother    Colon cancer Mother 68    Social History   Tobacco Use   Smoking status: Never   Smokeless tobacco: Never  Vaping Use   Vaping Use: Never used  Substance Use Topics   Alcohol use: Not Currently   Drug use: No    Home Medications Prior to Admission medications   Medication Sig Start Date End Date Taking? Authorizing Provider  EPINEPHrine 0.3 mg/0.3  mL IJ SOAJ injection See admin instructions. 11/07/19   [provider]  gabapentin (NEURONTIN) 100 MG capsule TAKE 2 CAPSULES(200 MG     TOTAL) AT BEDTIME 11/02/20   Lyndal Pulley, DO  levocetirizine (XYZAL) 5 MG tablet  01/15/16   [provider]  meloxicam (MOBIC) 7.5 MG tablet Take 1 tablet (7.5 mg total) by mouth daily. 08/25/20   Lyndal Pulley, DO  Multiple Vitamins-Minerals (MULTIVITAMIN PO) Take by mouth daily.    [provider]  Omega-3 Fatty Acids (FISH OIL PO) Take by mouth.    [provider]  Probiotic Product (PROBIOTIC PO) Take by mouth.    [provider]  progesterone (PROMETRIUM) 100 MG capsule  02/24/16   [provider]  thyroid (ARMOUR) 90 MG tablet Take 90 mg by mouth daily.    [provider]  tretinoin (RETIN-A) 0.05 % cream APPLY CREAM TOPICALLY ONCE DAILY 06/18/18   [provider]  zinc gluconate 50 MG tablet Take 50 mg by mouth every other day.    [provider]     Allergies    Vicks nyquil cold & flu night [dm-apap-cpm]  Review of Systems   Review of Systems All other systems are reviewed and are negative for acute change except as noted in the HPI.  Physical Exam Updated Vital Signs BP 103/80 (BP Location: Right Arm)   Pulse 69   Temp 98.7 F (37.1 C) (Oral)   Resp 17   Ht '5\' 1"'$  (1.549 m)   Wt 59.9 kg   SpO2 99%   BMI 24.94 kg/m   Physical Exam Vitals and nursing note reviewed.  Constitutional:      Appearance: She is well-developed. She is not ill-appearing or toxic-appearing.  HENT:     Head: Normocephalic and atraumatic.     Nose: Nose normal.  Eyes:     General: No scleral icterus.       Right eye: No discharge.        Left eye: No discharge.     Conjunctiva/sclera: Conjunctivae normal.  Neck:     Vascular: No JVD.  Cardiovascular:     Rate and Rhythm: Normal rate and regular rhythm.     Pulses: Normal pulses.          Dorsalis pedis pulses are 2+ on the right side and 2+ on the left side.     Heart sounds: Normal heart sounds.  Pulmonary:     Effort: Pulmonary effort is normal.     Breath sounds: Normal breath sounds.  Abdominal:     General: There is no distension.  Musculoskeletal:        General: Normal range of motion.     Cervical back: Normal range of motion.     Right lower leg: No edema.     Left lower leg: No edema.     Comments: Homans sign absent bilaterally, no lower extremity edema, no palpable cords, compartments are soft.  No tenderness to palpation of left calf. No palpable abscess or knot appreciated. No overlying skin changes.   Skin:    General: Skin is warm and dry.     Capillary Refill: Capillary refill takes less than 2 seconds.     Comments: Equal tactile temperature in all extremities  Neurological:     Mental Status: She is oriented to person, place, and time.     GCS: GCS eye subscore is 4. GCS verbal subscore is 5. GCS motor subscore is  6.     Comments: Fluent speech, no facial  droop.  Psychiatric:        Behavior: Behavior normal.    ED Results / Procedures / Treatments   Labs (all labs ordered are listed, but only abnormal results are displayed) Labs Reviewed - No data to display  EKG None  Radiology US Venous Img Lower Unilateral Left  Result Date: 03/27/2021 CLINICAL DATA:  Left calf discomfort for 3 days. History of a left hip replacement. EXAM: LEFT LOWER EXTREMITY VENOUS DOPPLER ULTRASOUND TECHNIQUE: Gray-scale sonography with compression, as well as color and duplex ultrasound, were performed to evaluate the deep venous system(s) from the level of the common femoral vein through the popliteal and proximal calf veins. COMPARISON:  None. FINDINGS: VENOUS Normal compressibility of the common femoral, superficial femoral, and popliteal veins, as well as the visualized calf veins. Visualized portions of profunda femoral vein and great saphenous vein unremarkable. No filling defects to suggest DVT on grayscale or color Doppler imaging. Doppler waveforms show normal direction of venous flow, normal respiratory plasticity and response to augmentation. Limited views of the contralateral common femoral vein are unremarkable. OTHER None. Limitations: none IMPRESSION: Negative. Electronically Signed   By: Lajean Manes M.D.   On: 03/27/2021 16:02    Procedures Procedures   Medications Ordered in ED Medications - No data to display  ED Course  I have reviewed the triage vital signs and the nursing notes.  Pertinent labs & imaging results that were available during my care of the patient were reviewed by me and considered in my medical decision making (see chart for details).    MDM Rules/Calculators/A&P                           History provided by patient with additional history obtained from chart review.    Presenting with left leg pain and concern for DVT. She has risk factor of being on estrogen patch. Exam is unremarkable. No abscess or rash appreciated  on exam. She is neurovascularly intact distally. [place] is negative for DVT.  Discussed results with patient.  She was most concerned about DVT and is reassured that imaging was negative.  Offered x-ray for further evaluation however patient already has appointment scheduled with sports medicine later this week and prefers to follow-up there.  Discussed symptomatic care at home and strict return precautions.  Patient discharged home in stable condition.   Portions of this note were generated with [employer]. Dictation errors may occur despite best attempts at proofreading.   Final Clinical Impression(s) / ED Diagnoses Final diagnoses:  Left leg pain    Rx / DC Orders ED Discharge Orders     None        Barrie Folk, PA-C 03/27/21 [place], [place], DO 03/29/21 [phone]

## 2021-03-29 ENCOUNTER — Telehealth: Payer: Self-pay | Admitting: *Deleted

## 2021-03-29 NOTE — Telephone Encounter (Signed)
Transition Care Management Unsuccessful Follow-up Telephone Call  Date of discharge and from where:  03/27/2021 - [facility]  Attempts:  1st Attempt  Reason for unsuccessful TCM follow-up call:  Unable to leave message

## 2021-03-30 NOTE — Telephone Encounter (Signed)
Transition Care Management Follow-up Telephone Call Date of discharge and from where: 03/27/2021 - [facility] How have you been since you were released from the hospital? "Feeling a little better" Any questions or concerns? No  Items Reviewed: Did the pt receive and understand the discharge instructions provided? Yes  Medications obtained and verified? Yes  Other? No  Any new allergies since your discharge? No  Dietary orders reviewed? No Do you have support at home? Yes    Functional Questionnaire: (I = Independent and D = Dependent) ADLs: I  Bathing/Dressing- I  Meal Prep- I  Eating- I  Maintaining continence- I  Transferring/Ambulation- I  Managing Meds- I  Follow up appointments reviewed:  PCP Hospital f/u appt confirmed? No   Specialist Hospital f/u appt confirmed? No   Are transportation arrangements needed? No  If their condition worsens, is the pt aware to call PCP or go to the Emergency Dept.? Yes Was the patient provided with contact information for the PCP's office or ED? Yes Was to pt encouraged to call back with questions or concerns? Yes

## 2021-04-01 ENCOUNTER — Other Ambulatory Visit: Payer: Self-pay

## 2021-04-01 ENCOUNTER — Ambulatory Visit (INDEPENDENT_AMBULATORY_CARE_PROVIDER_SITE_OTHER): Payer: 59 | Admitting: Family Medicine

## 2021-04-01 VITALS — BP 110/80 | HR 68 | Ht 61.0 in | Wt 136.0 lb

## 2021-04-01 DIAGNOSIS — M9902 Segmental and somatic dysfunction of thoracic region: Secondary | ICD-10-CM | POA: Diagnosis not present

## 2021-04-01 DIAGNOSIS — M9904 Segmental and somatic dysfunction of sacral region: Secondary | ICD-10-CM | POA: Diagnosis not present

## 2021-04-01 DIAGNOSIS — G8929 Other chronic pain: Secondary | ICD-10-CM

## 2021-04-01 DIAGNOSIS — M545 Low back pain, unspecified: Secondary | ICD-10-CM

## 2021-04-01 DIAGNOSIS — M9903 Segmental and somatic dysfunction of lumbar region: Secondary | ICD-10-CM

## 2021-04-01 NOTE — Progress Notes (Signed)
[name] [place] Phone: [phone] Subjective:   Andrea [name], am serving as a scribe for Dr. Hulan Saas.  I'm seeing this patient by the request  of:  Luetta Nutting, DO  CC: Neck and back pain follow-up  [id number]  Andrea [name] is a 50 y.o. female coming in with complaint of back and neck pain. OMT 02/18/2021. Patient states back and hips are doing great nothing significant.  Mild tightness noted.  Had some calf pain recently but did make patient go to emergency room.  There did have a Doppler done that was negative for any type of clot.  Patient states it is slowly getting better.  Medications patient has been prescribed: Gabapentin, meloxicam  Taking: Intermittently         Reviewed prior external information including notes and imaging from previsou exam, outside providers and external EMR if available.   As well as notes that were available from care everywhere and other healthcare systems.  Past medical history, social, surgical and family history all reviewed in electronic medical record.  No pertanent information unless stated regarding to the chief complaint.   Past Medical History:  Diagnosis Date   Abnormal Pap smear 2008   LGSIL   Arthritis of left hip    saw Dr. [name], had MRI   Headache    Osteopenia     Allergies  Allergen Reactions   Vicks Nyquil Cold & Flu Night [Dm-Apap-Cpm] Other (See Comments)    Heavy, tight chest     Review of Systems:  No headache, visual changes, nausea, vomiting, diarrhea, constipation, dizziness, abdominal pain, skin rash, fevers, chills, night sweats, weight loss, swollen lymph nodes, body aches, joint swelling, chest pain, shortness of breath, mood changes. POSITIVE muscle aches  Objective  Blood pressure 110/80, pulse 68, height '5\' 1"'$  (1.549 m), weight 136 lb (61.7 kg), SpO2 93 %.   General: No apparent distress alert and oriented x3  mood and affect normal, dressed appropriately.  HEENT: Pupils equal, extraocular movements intact  Respiratory: Patient's speak in full sentences and does not appear short of breath  Cardiovascular: No lower extremity edema, non tender, no erythema  Low back exam does show the patient does have some tenderness to palpation still in the paraspinal musculature.  Patient has some mild tightness noted in the parascapular region as well.  Patient's left calf very minimal discomfort noted in the belly of the medial gastroc head.  No masses appreciated.  No pain in the popliteal area.  Full range of motion of the ankle noted.  Osteopathic findings  T8 extended rotated and side bent left L2 flexed rotated and side bent right Sacrum right on right       Assessment and Plan:  Low back pain Chronic problem with mild exacerbation.  Doing relatively well.  I do believe the patient is compensating with patient how she is walking.  Discussed potential heel lift for the calf.  Increase activity slowly.  Follow-up again in 4 to 8 weeks.   Nonallopathic problems  Decision today to treat with OMT was based on Physical Exam  After verbal consent patient was treated with HVLA, ME, FPR techniques in  thoracic, lumbar, and sacral  areas  Patient tolerated the procedure well with improvement in symptoms  Patient given exercises, stretches and lifestyle modifications  See medications in patient instructions if given  Patient will follow up in 4-8 weeks  The above documentation has been reviewed and is accurate and complete Lyndal Pulley, DO        Note: This dictation was prepared with Dragon dictation along with smaller phrase technology. Any transcriptional errors that result from this process are unintentional.

## 2021-04-01 NOTE — Patient Instructions (Addendum)
Good to see you  Possibly heel list on [facility] 1/8th of an inch in left shoe  Try the graston tool  Drink some dang water See me again in 4-5 weeks

## 2021-04-01 NOTE — Assessment & Plan Note (Signed)
Chronic problem with mild exacerbation.  Doing relatively well.  I do believe the patient is compensating with patient how she is walking.  Discussed potential heel lift for the calf.  Increase activity slowly.  Follow-up again in 4 to 8 weeks.

## 2021-04-29 ENCOUNTER — Ambulatory Visit (INDEPENDENT_AMBULATORY_CARE_PROVIDER_SITE_OTHER): Payer: 59 | Admitting: Family Medicine

## 2021-04-29 ENCOUNTER — Encounter: Payer: Self-pay | Admitting: Family Medicine

## 2021-04-29 ENCOUNTER — Other Ambulatory Visit: Payer: Self-pay

## 2021-04-29 VITALS — BP 104/68 | HR 78 | Ht 61.0 in | Wt 137.0 lb

## 2021-04-29 DIAGNOSIS — M545 Low back pain, unspecified: Secondary | ICD-10-CM | POA: Diagnosis not present

## 2021-04-29 DIAGNOSIS — M9904 Segmental and somatic dysfunction of sacral region: Secondary | ICD-10-CM

## 2021-04-29 DIAGNOSIS — M9903 Segmental and somatic dysfunction of lumbar region: Secondary | ICD-10-CM | POA: Diagnosis not present

## 2021-04-29 DIAGNOSIS — G8929 Other chronic pain: Secondary | ICD-10-CM

## 2021-04-29 DIAGNOSIS — M9902 Segmental and somatic dysfunction of thoracic region: Secondary | ICD-10-CM | POA: Diagnosis not present

## 2021-04-29 NOTE — Progress Notes (Signed)
  [place] Phone: [phone] Subjective:   [name], am serving as a scribe for Dr. Hulan Saas. This visit occurred during the SARS-CoV-2 public health emergency.  Safety protocols were in place, including screening questions prior to the visit, additional usage of staff PPE, and extensive cleaning of exam room while observing appropriate contact time as indicated for disinfecting solutions.   I'm seeing this patient by the request  of:  Luetta Nutting, DO  CC: Low back and neck pain follow-up  [id number]  Andrea [name] is a 50 y.o. female coming in with complaint of back and neck pain. OMT on 8/52/2022. Patient states that her back is stiff. Little to no hip pain.  Patient has been lifting really regular.  Still notices with certain things such as deadlifts some mild discomfort in the hip but nothing severe.  Patient states that overall just more tightness than anything else  Medications patient has been prescribed: None  Taking:         Past Medical History:  Diagnosis Date   Abnormal Pap smear 2008   LGSIL   Arthritis of left hip    saw Dr. [name], had MRI   Headache    Osteopenia     Allergies  Allergen Reactions   Vicks Nyquil Cold & Flu Night [Dm-Apap-Cpm] Other (See Comments)    Heavy, tight chest     Review of Systems:  No headache, visual changes, nausea, vomiting, diarrhea, constipation, dizziness, abdominal pain, skin rash, fevers, chills, night sweats, weight loss, swollen lymph nodes, body aches, joint swelling, chest pain, shortness of breath, mood changes. POSITIVE muscle aches  Objective  Blood pressure 104/68, pulse 78, height 5\' 1"  (1.549 m), weight 137 lb (62.1 kg), SpO2 98 %.   General: No apparent distress alert and oriented x3 mood and affect normal, dressed appropriately.  HEENT: Pupils equal, extraocular movements intact  Respiratory: Patient's speak in  full sentences and does not appear short of breath  Cardiovascular: No lower extremity edema, non tender, no erythema  Low back exam does have some mild loss of lordosis.  Some tenderness to palpation in the paraspinal musculature mostly of the lumbar spine diffusely bilaterally.  Mild tightness with FABER test.  Osteopathic findings  T9 extended rotated and side bent left L2 flexed rotated and side bent right Sacrum right on right       Assessment and Plan:  Low back pain Low back noted.  Discussed icing regimen and home exercise, discussed which activities to do and which ones to avoid.  Increase activity slowly.  Follow-up again in 6 to 8 weeks   Nonallopathic problems  Decision today to treat with OMT was based on Physical Exam  After verbal consent patient was treated with HVLA, ME, FPR techniques in  thoracic, lumbar, and sacral  areas  Patient tolerated the procedure well with improvement in symptoms  Patient given exercises, stretches and lifestyle modifications  See medications in patient instructions if given  Patient will follow up in 6-8 weeks     The above documentation has been reviewed and is accurate and complete Lyndal Pulley, DO        Note: This dictation was prepared with Dragon dictation along with smaller phrase technology. Any transcriptional errors that result from this process are unintentional.

## 2021-04-29 NOTE — Patient Instructions (Signed)
See me in 6-8 weeks 

## 2021-04-29 NOTE — Assessment & Plan Note (Signed)
Low back noted.  Discussed icing regimen and home exercise, discussed which activities to do and which ones to avoid.  Increase activity slowly.  Follow-up again in 6 to 8 weeks

## 2021-05-21 ENCOUNTER — Ambulatory Visit (AMBULATORY_SURGERY_CENTER): Payer: 59

## 2021-05-21 ENCOUNTER — Encounter: Payer: 59 | Admitting: Gastroenterology

## 2021-05-21 ENCOUNTER — Encounter: Payer: Self-pay | Admitting: Gastroenterology

## 2021-05-21 ENCOUNTER — Other Ambulatory Visit: Payer: Self-pay

## 2021-05-21 VITALS — Ht 61.0 in | Wt 132.0 lb

## 2021-05-21 DIAGNOSIS — Z800 Family history of malignant neoplasm of digestive organs: Secondary | ICD-10-CM

## 2021-05-21 DIAGNOSIS — Z860100 Personal history of colon polyps, unspecified: Secondary | ICD-10-CM

## 2021-05-21 DIAGNOSIS — Z1211 Encounter for screening for malignant neoplasm of colon: Secondary | ICD-10-CM

## 2021-05-21 MED ORDER — PEG-KCL-NACL-NASULF-NA ASC-C 100 G PO SOLR: 1.0000 | Freq: Once | ORAL | 0 refills | Status: AC

## 2021-05-21 NOTE — Progress Notes (Signed)
Pre visit completed via phone call;  Patient verified name, DOB, and address; No egg or soy allergy known to patient  No issues known to pt with past sedation with any surgeries or procedures---however, patient reports she is "slow to wake up from anesthesia" Patient denies ever being told they had issues or difficulty with intubation  No FH of Malignant Hyperthermia Pt is not on diet pills Pt is not on home 02  Pt is not on blood thinners  Pt denies issues with constipation  No A fib or A flutter Pt is fully vaccinated for Covid  NO PA's for preps discussed with pt in PV today  Discussed with pt there will be an out-of-pocket cost for prep and that varies from $0 to 70 +  dollars - pt verbalized understanding  Due to the COVID-19 pandemic we are asking patients to follow certain guidelines in PV and the [place]   Pt aware of COVID protocols and [facility] guidelines

## 2021-05-24 ENCOUNTER — Telehealth: Payer: Self-pay | Admitting: Gastroenterology

## 2021-05-24 DIAGNOSIS — Z86010 Personal history of colonic polyps: Secondary | ICD-10-CM

## 2021-05-24 DIAGNOSIS — Z800 Family history of malignant neoplasm of digestive organs: Secondary | ICD-10-CM

## 2021-05-24 MED ORDER — NA SULFATE-K SULFATE-MG SULF 17.5-3.13-1.6 GM/177ML PO SOLN: 1.0000 | Freq: Once | ORAL | 0 refills | Status: DC

## 2021-05-24 NOTE — Telephone Encounter (Signed)
Called pt- she has not heard from the pharmacy --pt asked about Clenpiq- her husband did Clenpiq back in March 2022- we had a sample- pt to pick up Clenpiq sample and new instructions-  we went over Clenpiq today over the phone- pt verbalized understanding  and My Chart instructions to pt as well

## 2021-05-24 NOTE — Telephone Encounter (Signed)
Inbound call from [facility] stating moviprep is not covered neither is the generic brand. Would like to know what else can the patient have.

## 2021-05-24 NOTE — Telephone Encounter (Signed)
Andrea [name] [phone] about prep --  [facility] states this is an error on their part- she cannot find pt in their system- states nothing for me to do about prep change    I was sent incorrect Pharmacy= [facility] [place]- [facility]-  Pharmacist states Movi name brand or generic is not covered- I sent in [place]- Pharmacist will let me know if it's covered or not and I will get pt new  instructions

## 2021-06-04 ENCOUNTER — Ambulatory Visit (AMBULATORY_SURGERY_CENTER): Payer: 59 | Admitting: Gastroenterology

## 2021-06-04 ENCOUNTER — Encounter: Payer: Self-pay | Admitting: Gastroenterology

## 2021-06-04 VITALS — BP 111/65 | HR 72 | Temp 99.8°F | Resp 11 | Ht 60.0 in | Wt 132.0 lb

## 2021-06-04 DIAGNOSIS — Z1211 Encounter for screening for malignant neoplasm of colon: Secondary | ICD-10-CM | POA: Diagnosis not present

## 2021-06-04 DIAGNOSIS — Z800 Family history of malignant neoplasm of digestive organs: Secondary | ICD-10-CM | POA: Diagnosis not present

## 2021-06-04 DIAGNOSIS — D120 Benign neoplasm of cecum: Secondary | ICD-10-CM | POA: Diagnosis not present

## 2021-06-04 MED ORDER — SODIUM CHLORIDE 0.9 % IV SOLN: 500.0000 mL | Freq: Once | INTRAVENOUS | Status: DC

## 2021-06-04 NOTE — Patient Instructions (Signed)
Impression/Recommendations:  Polyp handout given to patient.  Resume previous diet. Continue present medications. Await pathology results.  Repeat colonoscopy for surveillance based on pathology results.  Return to GI office as needed.  YOU HAD AN ENDOSCOPIC PROCEDURE TODAY AT [facility]:   Refer to the procedure report that was given to you for any specific questions about what was found during the examination.  If the procedure report does not answer your questions, please call your gastroenterologist to clarify.  If you requested that your care partner not be given the details of your procedure findings, then the procedure report has been included in a sealed envelope for you to review at your convenience later.  YOU SHOULD EXPECT: Some feelings of bloating in the abdomen. Passage of more gas than usual.  Walking can help get rid of the air that was put into your GI tract during the procedure and reduce the bloating. If you had a lower endoscopy (such as a colonoscopy or flexible sigmoidoscopy) you may notice spotting of blood in your stool or on the toilet paper. If you underwent a bowel prep for your procedure, you may not have a normal bowel movement for a few days.  Please Note:  You might notice some irritation and congestion in your nose or some drainage.  This is from the oxygen used during your procedure.  There is no need for concern and it should clear up in a day or so.  SYMPTOMS TO REPORT IMMEDIATELY:  Following lower endoscopy (colonoscopy or flexible sigmoidoscopy):  Excessive amounts of blood in the stool  Significant tenderness or worsening of abdominal pains  Swelling of the abdomen that is new, acute  Fever of 100F or higher For urgent or emergent issues, a gastroenterologist can be reached at any hour by calling [phone]. Do not use MyChart messaging for urgent concerns.    DIET:  We do recommend a small meal at first, but then you may  proceed to your regular diet.  Drink plenty of fluids but you should avoid alcoholic beverages for 24 hours.  ACTIVITY:  You should plan to take it easy for the rest of today and you should NOT DRIVE or use heavy machinery until tomorrow (because of the sedation medicines used during the test).    FOLLOW UP: Our staff will call the number listed on your records 48-72 hours following your procedure to check on you and address any questions or concerns that you may have regarding the information given to you following your procedure. If we do not reach you, we will leave a message.  We will attempt to reach you two times.  During this call, we will ask if you have developed any symptoms of COVID 19. If you develop any symptoms (ie: fever, flu-like symptoms, shortness of breath, cough etc.) before then, please call [phone].  If you test positive for Covid 19 in the 2 weeks post procedure, please call and report this information to [place].    If any biopsies were taken you will be contacted by phone or by letter within the next 1-3 weeks.  Please call us at [phone] if you have not heard about the biopsies in 3 weeks.    SIGNATURES/CONFIDENTIALITY: You and/or your care partner have signed paperwork which will be entered into your electronic medical record.  These signatures attest to the fact that that the information above on your After Visit Summary has been reviewed and is understood.  Full responsibility  of the confidentiality of this discharge information lies with you and/or your care-partner.

## 2021-06-04 NOTE — Progress Notes (Signed)
Called to room to assist during endoscopic procedure.  Patient ID and intended procedure confirmed with present staff. Received instructions for my participation in the procedure from the performing physician.  

## 2021-06-04 NOTE — Op Note (Signed)
[facility] Patient Name: Andrea [name] Procedure Date: 06/04/2021 10:19 AM MRN: [id number] Endoscopist: Gerrit Heck , MD Age: 50 Referring MD:  Date of Birth: 29-Jan-1971 Gender: Female Account #: [id number] Procedure:                Colonoscopy Indications:              Screening for colorectal malignant neoplasm, This                            is the patient's first colonoscopy Medicines:                Monitored Anesthesia Care Procedure:                Pre-Anesthesia Assessment:                           - Prior to the procedure, a History and Physical                            was performed, and patient medications and                            allergies were reviewed. The patient's tolerance of                            previous anesthesia was also reviewed. The risks                            and benefits of the procedure and the sedation                            options and risks were discussed with the patient.                            All questions were answered, and informed consent                            was obtained. Prior Anticoagulants: The patient has                            taken no previous anticoagulant or antiplatelet                            agents. ASA Grade Assessment: II - A patient with                            mild systemic disease. After reviewing the risks                            and benefits, the patient was deemed in                            satisfactory condition to undergo the procedure.  After obtaining informed consent, the colonoscope                            was passed under direct vision. Throughout the                            procedure, the patient's blood pressure, pulse, and                            oxygen saturations were monitored continuously. The                            PCF-HQ190L Colonoscope was introduced through the                            anus and advanced to  the the cecum, identified by                            appendiceal orifice and ileocecal valve. The                            colonoscopy was performed without difficulty. The                            patient tolerated the procedure well. The quality                            of the bowel preparation was good. The ileocecal                            valve, appendiceal orifice, and rectum were                            photographed. Scope In: 10:29:09 AM Scope Out: 10:45:32 AM Scope Withdrawal Time: 0 hours 12 minutes 18 seconds  Total Procedure Duration: 0 hours 16 minutes 23 seconds  Findings:                 The perianal and digital rectal examinations were                            normal.                           Three sessile polyps were found in the cecum. The                            polyps were 3 to 8 mm in size. These polyps were                            removed with a cold snare. Resection and retrieval                            were complete. Estimated blood loss was minimal.  The exam was otherwise normal throughout the                            remainder of the colon.                           The retroflexed view of the distal rectum and anal                            verge was normal and showed no anal or rectal                            abnormalities. Complications:            No immediate complications. Estimated Blood Loss:     Estimated blood loss was minimal. Impression:               - Three 3 to 8 mm polyps in the cecum, removed with                            a cold snare. Resected and retrieved.                           - The distal rectum and anal verge are normal on                            retroflexion view. Recommendation:           - Patient has a contact number available for                            emergencies. The signs and symptoms of potential                            delayed complications were discussed  with the                            patient. Return to normal activities tomorrow.                            Written discharge instructions were provided to the                            patient.                           - Resume previous diet.                           - Continue present medications.                           - Await pathology results.                           - Repeat colonoscopy for surveillance based on  pathology results.                           - Return to GI office PRN. Gerrit Heck, MD 06/04/2021 10:51:00 AM

## 2021-06-04 NOTE — Progress Notes (Signed)
Report given to PACU, vss 

## 2021-06-04 NOTE — Progress Notes (Signed)
Pt's states no medical or surgical changes since previsit or office visit. 

## 2021-06-04 NOTE — Progress Notes (Signed)
GASTROENTEROLOGY PROCEDURE H&P NOTE   Primary Care Physician: Luetta Nutting, DO    Reason for Procedure:  Colon Cancer screening  Plan:    Colonoscopy  Patient is appropriate for endoscopic procedure(s) in the ambulatory ([facility]) setting.  The nature of the procedure, as well as the risks, benefits, and alternatives were carefully and thoroughly reviewed with the patient. Ample time for discussion and questions allowed. The patient understood, was satisfied, and agreed to proceed.     HPI: Andrea [name] is a 50 y.o. female who presents for colonoscopy for routine Colon Cancer screening.  No active GI symptoms.  Fhx notable for mother with colon cancer age 68, MGM with colon cancer at age 18, and father with colon polyps. Patient is otherwise without complaints or active issues today.  Past Medical History:  Diagnosis Date   Abnormal Pap smear 2008   LGSIL   Arthritis of left hip    saw Dr. [name], had MRI/had sx to fix-   Headache    Osteopenia    Seasonal allergies     Past Surgical History:  Procedure Laterality Date   BREAST ENHANCEMENT SURGERY Bilateral 1998   BREAST SURGERY Right 01/2007   revision on breast due to tight muscles   CESAREAN SECTION  1995   NASAL SEPTOPLASTY W/ TURBINOPLASTY  03/2019   TUBAL LIGATION  2008   VAGINA RECONSTRUCTION SURGERY  10/2013   Vaginal rejuvination surgery, Dr. [name], [place]- due to muscle tear   WISDOM TOOTH EXTRACTION      Prior to Admission medications   Medication Sig Start Date End Date Taking? Authorizing Provider  EPINEPHrine 0.3 mg/0.3 mL IJ SOAJ injection See admin instructions. 11/07/19  Yes [provider]  estradiol (VIVELLE-DOT) 0.025 MG/24HR Place 1 patch onto the skin. 04/10/21  Yes [provider]  levocetirizine (XYZAL) 5 MG tablet Take 5 mg by mouth every evening. 01/15/16  Yes [provider]  Multiple Vitamins-Minerals (MULTIVITAMIN PO) Take 1 tablet by mouth daily.   Yes  [provider]  Omega-3 Fatty Acids (FISH OIL PO) Take 1 capsule by mouth daily at 6 (six) AM.   Yes [provider]  Probiotic Product (PROBIOTIC PO) Take 1 capsule by mouth daily at 6 (six) AM.   Yes [provider]  progesterone (PROMETRIUM) 100 MG capsule Take 2 capsules by mouth daily at 2 am. 04/26/21  Yes [provider]  thyroid (ARMOUR) 90 MG tablet Take 90 mg by mouth daily.   Yes [provider]  tretinoin (RETIN-A) 0.1 % cream Apply 1 application topically daily. 12/16/20  Yes [provider]  progesterone (PROMETRIUM) 200 MG capsule Take 200 mg by mouth at bedtime. Patient not taking: Reported on 06/04/2021 05/27/21   [provider]  zinc gluconate 50 MG tablet Take 50 mg by mouth every other day.    [provider]    Current Outpatient Medications  Medication Sig Dispense Refill   EPINEPHrine 0.3 mg/0.3 mL IJ SOAJ injection See admin instructions.     estradiol (VIVELLE-DOT) 0.025 MG/24HR Place 1 patch onto the skin.     levocetirizine (XYZAL) 5 MG tablet Take 5 mg by mouth every evening.     Multiple Vitamins-Minerals (MULTIVITAMIN PO) Take 1 tablet by mouth daily.     Omega-3 Fatty Acids (FISH OIL PO) Take 1 capsule by mouth daily at 6 (six) AM.     Probiotic Product (PROBIOTIC PO) Take 1 capsule by mouth daily at 6 (six)  AM.     progesterone (PROMETRIUM) 100 MG capsule Take 2 capsules by mouth daily at 2 am.     thyroid (ARMOUR) 90 MG tablet Take 90 mg by mouth daily.     tretinoin (RETIN-A) 0.1 % cream Apply 1 application topically daily.     progesterone (PROMETRIUM) 200 MG capsule Take 200 mg by mouth at bedtime. (Patient not taking: Reported on 06/04/2021)     zinc gluconate 50 MG tablet Take 50 mg by mouth every other day.     Current Facility-Administered Medications  Medication Dose Route Frequency Provider Last Rate Last Admin   0.9 %  sodium chloride infusion  500 mL Intravenous Once  Jnaya Butrick V, DO        Allergies as of 06/04/2021 - Review Complete 06/04/2021  Allergen Reaction Noted   Vicks nyquil cold & flu night [dm-apap-cpm] Other (See Comments) 10/21/2020    Family History  Problem Relation Age of Onset   Colon polyps Mother 69   Colon cancer Mother 6   Colon polyps Father 84   Colon polyps Maternal Grandmother 59   Colon cancer Maternal Grandmother 85   Congestive Heart Failure Maternal Grandfather    Colon polyps Maternal Grandfather    Diabetes Paternal Grandmother    Hypertension Paternal Grandmother    Stroke Paternal Grandfather    Esophageal cancer Neg Hx    Stomach cancer Neg Hx    Rectal cancer Neg Hx     Social History   Socioeconomic History   Marital status: Married    Spouse name: Not on file   Number of children: Not on file   Years of education: Not on file   Highest education level: Not on file  Occupational History   Not on file  Tobacco Use   Smoking status: Never   Smokeless tobacco: Never  Vaping Use   Vaping Use: Never used  Substance and Sexual Activity   Alcohol use: Not Currently   Drug use: No   Sexual activity: Yes    Partners: Male    Birth control/protection: Surgical    Comment: BTL  Other Topics Concern   Not on file  Social History Narrative   Not on file   Social Determinants of Health   Financial Resource Strain: Not on file  Food Insecurity: Not on file  Transportation Needs: Not on file  Physical Activity: Not on file  Stress: Not on file  Social Connections: Not on file  Intimate Partner Violence: Not on file    Physical Exam: Vital signs in last 24 hours: @BP  115/60   Pulse 86   Temp 99.8 F (37.7 C) (Temporal)   Ht 5' (1.524 m)   Wt 132 lb (59.9 kg)   SpO2 99%   BMI 25.78 kg/m  GEN: NAD EYE: Sclerae anicteric ENT: MMM CV: Non-tachycardic Pulm: CTA b/l GI: Soft, NT/ND NEURO:  Alert & Oriented x 3   Gerrit Heck, DO [facility]   06/04/2021  10:22 AM

## 2021-06-08 ENCOUNTER — Telehealth: Payer: Self-pay

## 2021-06-08 NOTE — Telephone Encounter (Signed)
  Follow up Call-  Call back number 06/04/2021  Post procedure Call Back phone  # [phone]  Permission to leave phone message Yes  Some recent data might be hidden     Patient questions:  Do you have a fever, pain , or abdominal swelling? No. Pain Score  0 *  Have you tolerated food without any problems? Yes.    Have you been able to return to your normal activities? Yes.    Do you have any questions about your discharge instructions: Diet   No. Medications  No. Follow up visit  No.  Do you have questions or concerns about your Care? No.  Actions: * If pain score is 4 or above: No action needed, pain <4.

## 2021-06-10 ENCOUNTER — Ambulatory Visit: Payer: 59 | Admitting: Family Medicine

## 2021-06-16 ENCOUNTER — Encounter: Payer: Self-pay | Admitting: Gastroenterology

## 2021-07-14 NOTE — Progress Notes (Signed)
[place] Phone: [phone] Subjective:   [name], am serving as a scribe for Dr. Hulan Saas. This visit occurred during the SARS-CoV-2 public health emergency.  Safety protocols were in place, including screening questions prior to the visit, additional usage of staff PPE, and extensive cleaning of exam room while observing appropriate contact time as indicated for disinfecting solutions.   I'm seeing this patient by the request  of:  Luetta Nutting, DO  CC: Back and neck pain follow-up  [id number]  MELYNA HURON is a 50 y.o. female coming in with complaint of back and neck pain. OMT on 04/29/2021. Patient states that she has been doing well. Notes some clicking in the hip but no pain. Tightness in back and neck.   Medications patient has been prescribed: None  Taking:         Reviewed prior external information including notes and imaging from previsou exam, outside providers and external EMR if available.   As well as notes that were available from care everywhere and other healthcare systems.  Past medical history, social, surgical and family history all reviewed in electronic medical record.  No pertanent information unless stated regarding to the chief complaint.   Past Medical History:  Diagnosis Date   Abnormal Pap smear 2008   LGSIL   Arthritis of left hip    saw Dr. [name], had MRI/had sx to fix-   Headache    Osteopenia    Seasonal allergies     Allergies  Allergen Reactions   Vicks Nyquil Cold & Flu Night [Dm-Apap-Cpm] Other (See Comments)    Heavy, tight chest     Review of Systems:  No headache, visual changes, nausea, vomiting, diarrhea, constipation, dizziness, abdominal pain, skin rash, fevers, chills, night sweats, weight loss, swollen lymph nodes, body aches, joint swelling, chest pain, shortness of breath, mood changes. POSITIVE muscle aches  Objective   Blood pressure 126/72, pulse 83, height 5' (1.524 m), weight 137 lb (62.1 kg), SpO2 98 %.   General: No apparent distress alert and oriented x3 mood and affect normal, dressed appropriately.  HEENT: Pupils equal, extraocular movements intact  Respiratory: Patient's speak in full sentences and does not appear short of breath  Cardiovascular: No lower extremity edema, non tender, no erythema  Patient does have some tightness noted in the interscapular region.  Seems to be right greater than left.  Patient does have good range of motion of the low back but does still have tightness with [name] left greater than right.  Osteopathic findings   T9 extended rotated and side bent left L2 flexed rotated and side bent right Sacrum r left on left       Assessment and Plan:  Low back pain Chronic but stable overall.  Discussed with patient to continue to stay active.  Patient since she has gotten her hip replacement has been able to ride horses and is working out on a regular basis.  No significant change in management.  Can follow-up again in 6 to 8 weeks.   Nonallopathic problems  Decision today to treat with OMT was based on Physical Exam  After verbal consent patient was treated with HVLA, ME, FPR techniques in  thoracic, lumbar, and sacral  areas  Patient tolerated the procedure well with improvement in symptoms  Patient given exercises, stretches and lifestyle modifications  See medications in patient instructions if given  Patient will follow up in  4-8 weeks      The above documentation has been reviewed and is accurate and complete Lyndal Pulley, DO        Note: This dictation was prepared with Dragon dictation along with smaller phrase technology. Any transcriptional errors that result from this process are unintentional.

## 2021-07-15 ENCOUNTER — Ambulatory Visit (INDEPENDENT_AMBULATORY_CARE_PROVIDER_SITE_OTHER): Payer: 59 | Admitting: Family Medicine

## 2021-07-15 ENCOUNTER — Other Ambulatory Visit: Payer: Self-pay

## 2021-07-15 ENCOUNTER — Encounter: Payer: Self-pay | Admitting: Family Medicine

## 2021-07-15 VITALS — BP 126/72 | HR 83 | Ht 60.0 in | Wt 137.0 lb

## 2021-07-15 DIAGNOSIS — G8929 Other chronic pain: Secondary | ICD-10-CM

## 2021-07-15 DIAGNOSIS — M545 Low back pain, unspecified: Secondary | ICD-10-CM

## 2021-07-15 DIAGNOSIS — M9904 Segmental and somatic dysfunction of sacral region: Secondary | ICD-10-CM | POA: Diagnosis not present

## 2021-07-15 DIAGNOSIS — M9903 Segmental and somatic dysfunction of lumbar region: Secondary | ICD-10-CM

## 2021-07-15 DIAGNOSIS — M9902 Segmental and somatic dysfunction of thoracic region: Secondary | ICD-10-CM

## 2021-07-15 NOTE — Assessment & Plan Note (Signed)
Chronic but stable overall.  Discussed with patient to continue to stay active.  Patient since she has gotten her hip replacement has been able to ride horses and is working out on a regular basis.  No significant change in management.  Can follow-up again in 6 to 8 weeks.

## 2021-07-15 NOTE — Patient Instructions (Signed)
Great to see you Good luck with grumpy elf  See me in 6 weeks

## 2021-08-14 IMAGING — DX DG HIP (WITH OR WITHOUT PELVIS) 2-3V*L*
3 series · 3 of 3 positions shown · non-contrast
Comparison: Outside plain film 03/02/2016.  MRI of 03/18/2016.

CLINICAL DATA: Left hip pain without trauma

EXAM:
DG HIP (WITH OR WITHOUT PELVIS) 2-3V LEFT

[pelvis ap]
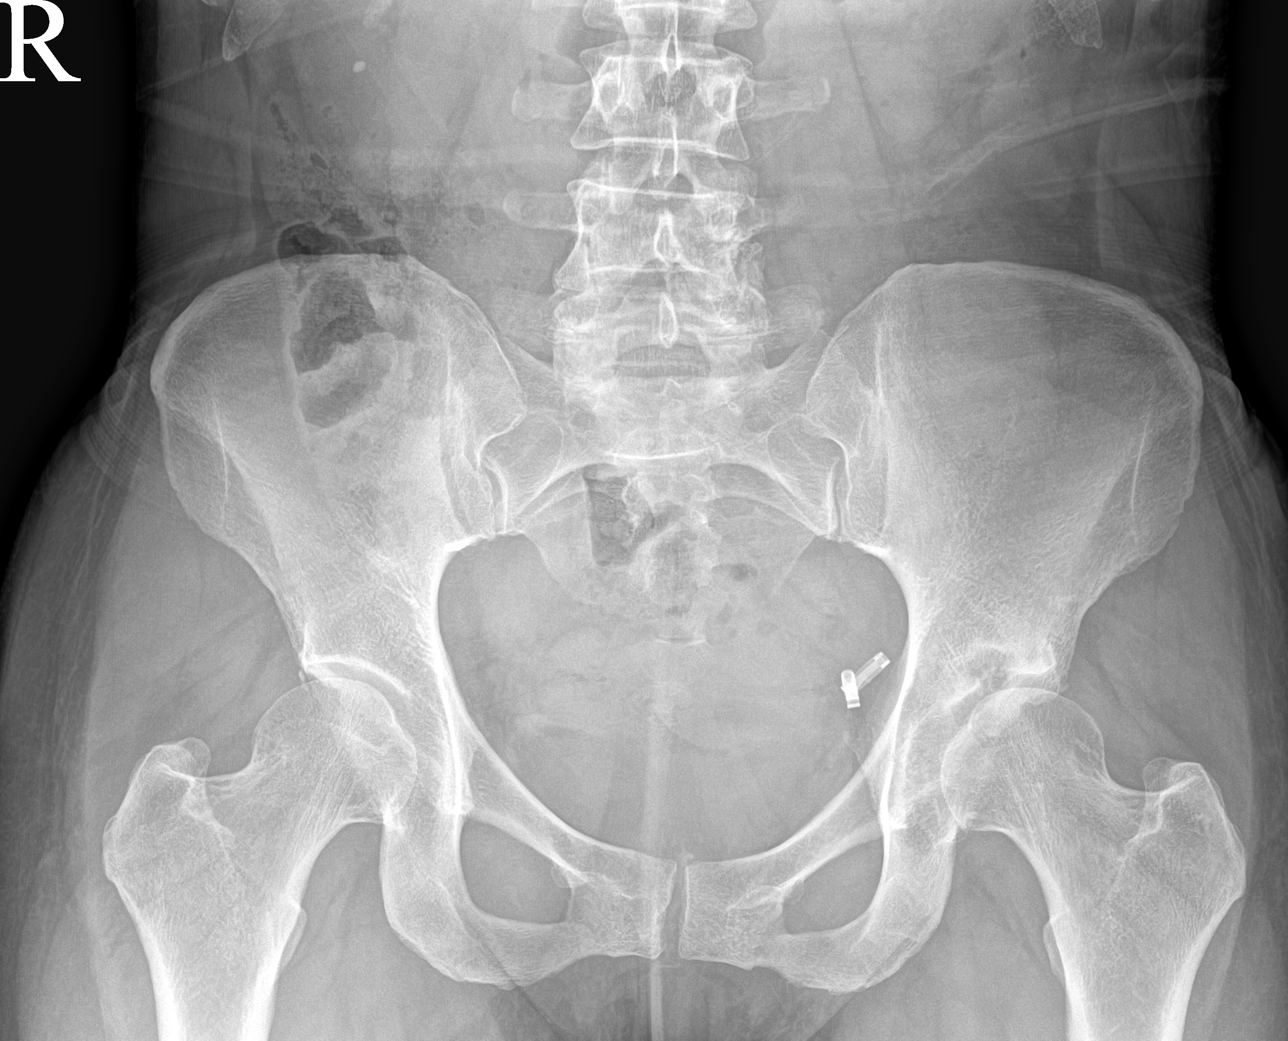

[hip ap]
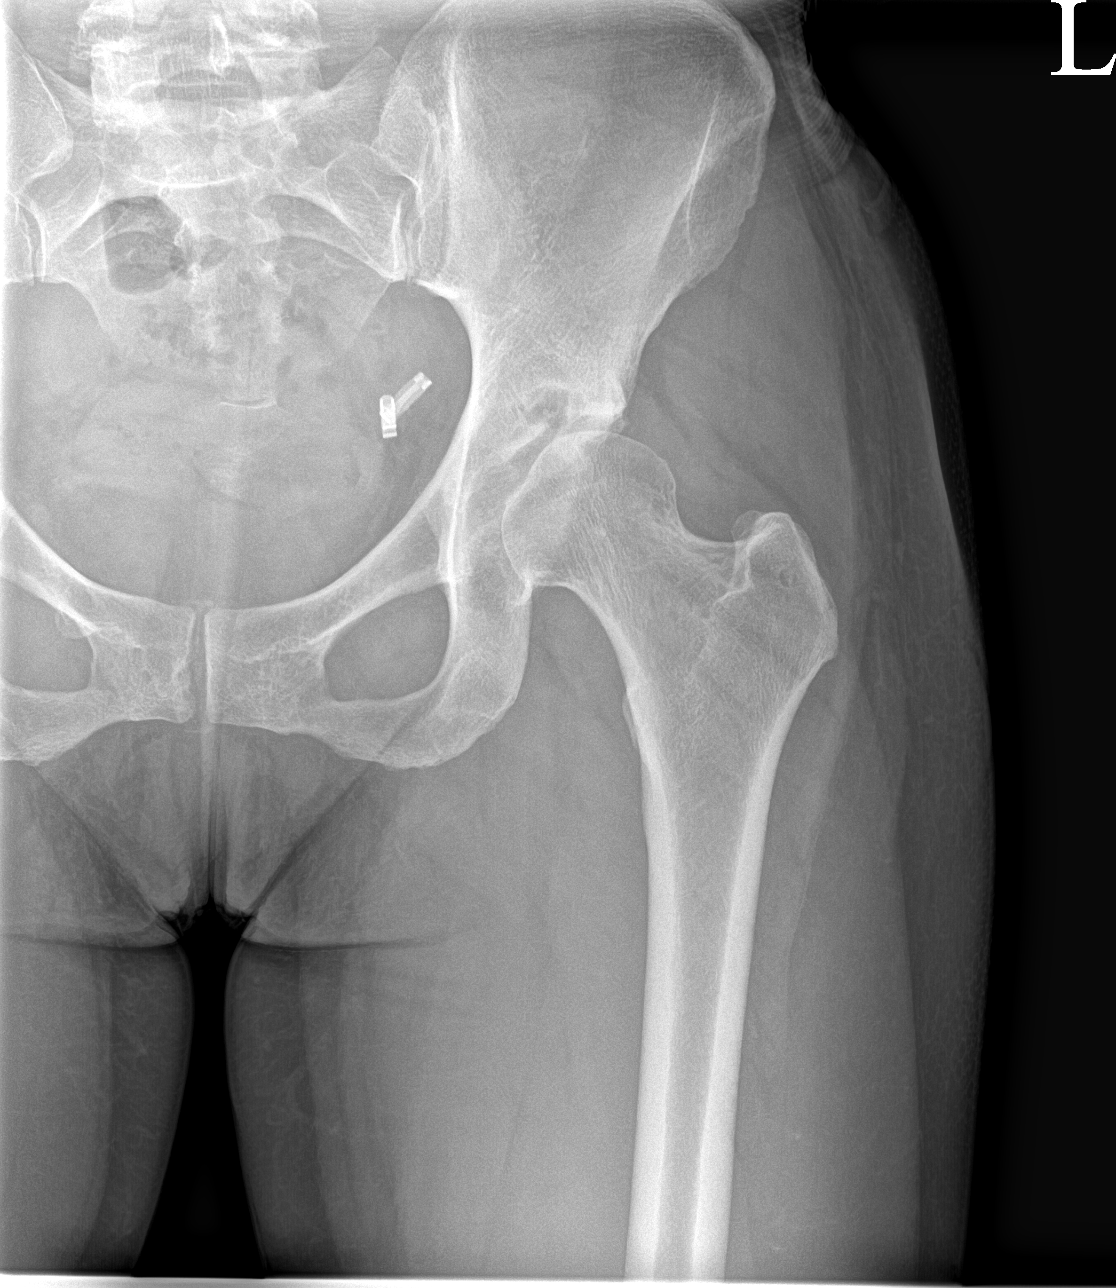

[hip frog leg]
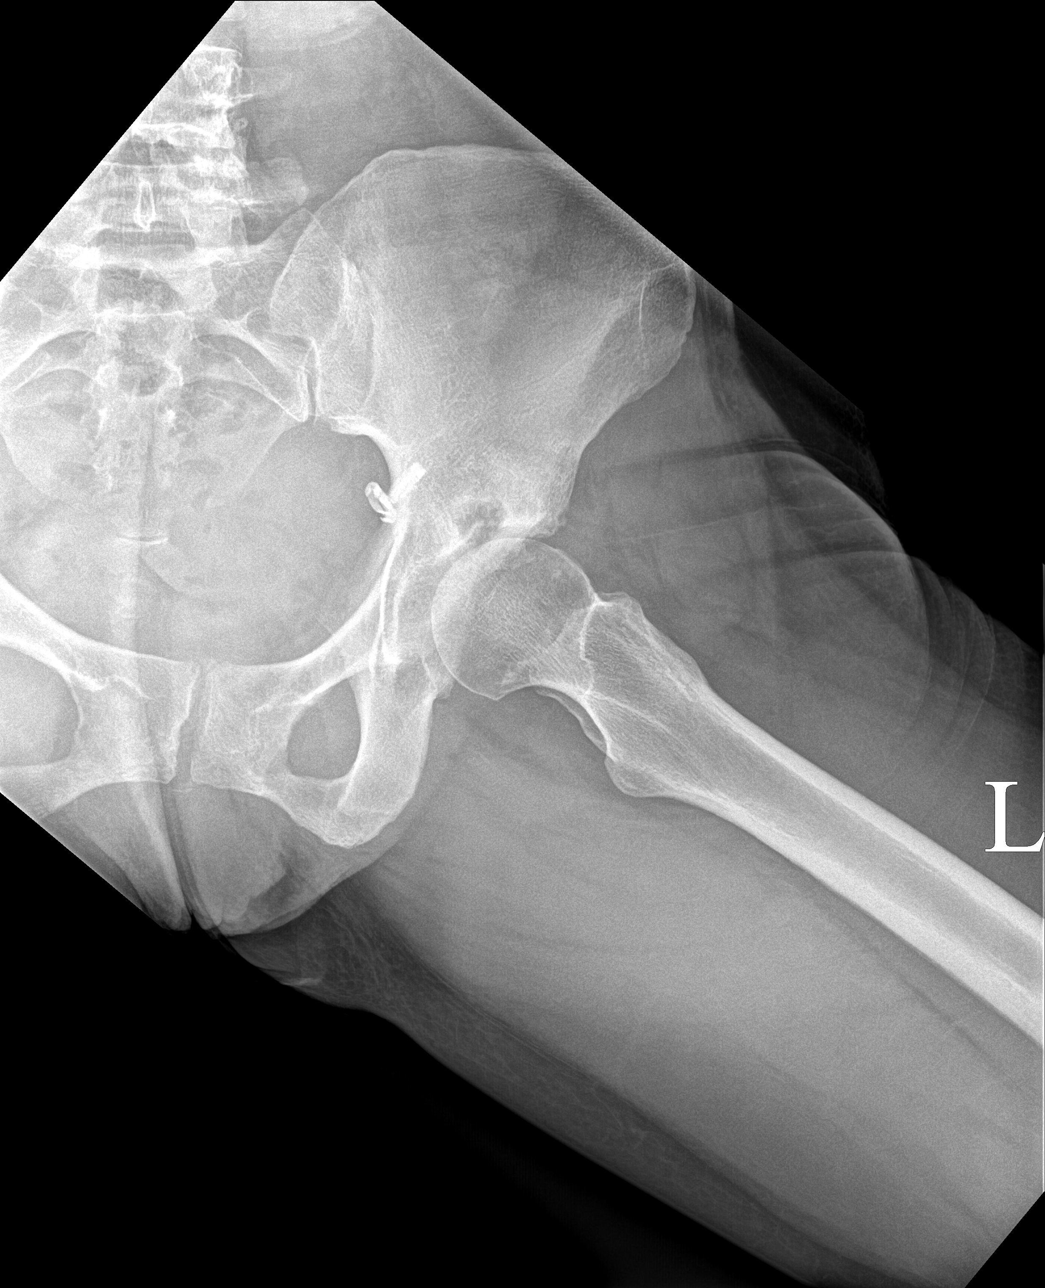

[3 of 3 positions shown; findings below may reference images not displayed]

FINDINGS: No acute fracture or dislocation. Sacroiliac joints are symmetric.
Degenerative changes, including joint space narrowing, subchondral
sclerosis, and acetabular subchondral cyst formation. These are
mild, age advanced, and progressive since 03/02/2016 plain film.
Tubal ligation.
IMPRESSION: Left hip osteoarthritis, age advanced and progressive compared to

## 2021-08-25 NOTE — Progress Notes (Signed)
Andrea [name] Phone: [phone] Subjective:   Andrea [name], am serving as a scribe for Dr. Hulan Saas.This visit occurred during the SARS-CoV-2 public health emergency.  Safety protocols were in place, including screening questions prior to the visit, additional usage of staff PPE, and extensive cleaning of exam room while observing appropriate contact time as indicated for disinfecting solutions.  I'm seeing this patient by the request  of:  Luetta Nutting, DO  CC: back and neck pain   [id number]  Andrea [name] is a 51 y.o. female coming in with complaint of back and neck pain. OMT on 07/15/2021. Patient states that she has been doing well.   Medications patient has been prescribed: None  Taking:         Reviewed prior external information including notes and imaging from previsou exam, outside providers and external EMR if available.   As well as notes that were available from care everywhere and other healthcare systems.  Past medical history, social, surgical and family history all reviewed in electronic medical record.  [name] pertanent information unless stated regarding to the chief complaint.   Past Medical History:  Diagnosis Date   Abnormal Pap smear 2008   LGSIL   Arthritis of left hip    saw Dr. [name], had MRI/had sx to fix-   Headache    Osteopenia    Seasonal allergies     Allergies  Allergen Reactions   Vicks Nyquil Cold & Flu Night [Dm-Apap-Cpm] Other (See Comments)    Heavy, tight chest     Review of Systems:  [name], visual changes, nausea, vomiting, diarrhea, constipation, dizziness, abdominal pain, skin rash, fevers, chills, night sweats, weight loss, swollen lymph nodes, body aches, joint swelling, chest pain, shortness of breath, mood changes. POSITIVE muscle aches  Objective  Blood pressure 130/66, pulse 85, height 5' (1.524 m), SpO2 97 %.   General: [name] alert and oriented x3 mood and affect normal, dressed appropriately.  HEENT: Pupils equal, extraocular movements intact  Respiratory: Patient's speak in full sentences and does not appear short of breath  Low back shows mild loss of lordosis tightness of left side negative [employer] findings T3 extended rotated and side bent right  T6 extended rotated and side bent left L2 flexed rotated and side bent right Sacrum right on right       Assessment and Plan:  Low back pain Low back exam does have some loss of lordosis.  Some tenderness to palpation in the paraspinal musculature.  Patient does have still tightness noted around the left hip and likely some of it is secondary to the capsule.  We discussed different stretches that I think will be beneficial.  Patient will follow up with me again in 6 to 8 weeks.    Nonallopathic problems  Decision today to treat with OMT was based on Physical Exam  After verbal consent patient was treated with HVLA, ME, FPR techniques in  thoracic, lumbar, and sacral  areas  Patient tolerated the procedure well with improvement in symptoms  Patient given exercises, stretches and lifestyle modifications  See medications in patient instructions if given  Patient will follow up in 4-8 weeks      The above documentation has been reviewed and is accurate and complete Lyndal Pulley, DO        Note: This dictation was prepared with Dragon dictation along with smaller phrase  technology. Any transcriptional errors that result from this process are unintentional.

## 2021-08-26 ENCOUNTER — Other Ambulatory Visit: Payer: Self-pay

## 2021-08-26 ENCOUNTER — Ambulatory Visit: Payer: 59 | Admitting: Family Medicine

## 2021-08-26 ENCOUNTER — Encounter: Payer: Self-pay | Admitting: Family Medicine

## 2021-08-26 VITALS — BP 130/66 | HR 85 | Ht 60.0 in

## 2021-08-26 DIAGNOSIS — M9904 Segmental and somatic dysfunction of sacral region: Secondary | ICD-10-CM

## 2021-08-26 DIAGNOSIS — M9903 Segmental and somatic dysfunction of lumbar region: Secondary | ICD-10-CM | POA: Diagnosis not present

## 2021-08-26 DIAGNOSIS — M9902 Segmental and somatic dysfunction of thoracic region: Secondary | ICD-10-CM

## 2021-08-26 DIAGNOSIS — M545 Low back pain, unspecified: Secondary | ICD-10-CM

## 2021-08-26 DIAGNOSIS — G8929 Other chronic pain: Secondary | ICD-10-CM

## 2021-08-26 NOTE — Assessment & Plan Note (Signed)
Low back exam does have some loss of lordosis.  Some tenderness to palpation in the paraspinal musculature.  Patient does have still tightness noted around the left hip and likely some of it is secondary to the capsule.  We discussed different stretches that I think will be beneficial.  Patient will follow up with me again in 6 to 8 weeks.

## 2021-08-26 NOTE — Patient Instructions (Addendum)
Raise set on [employer] Strengthen VMO See me in 4 weeks

## 2021-09-29 NOTE — Progress Notes (Signed)
°  [place] Phone: [phone] Subjective:   [name], am serving as a scribe for Dr. Hulan Saas.  This visit occurred during the SARS-CoV-2 public health emergency.  Safety protocols were in place, including screening questions prior to the visit, additional usage of staff PPE, and extensive cleaning of exam room while observing appropriate contact time as indicated for disinfecting solutions.  I'm seeing this patient by the request  of:  Luetta Nutting, DO  CC: Back and neck pain follow-up  [id number]  FIANA GLADU is a 51 y.o. female coming in with complaint of back and neck pain. OMT on 08/26/2021. Patient states continues to have some discomfort and pain.  Patient states that it seems to be more in the back and anywhere else.  Very minimal though.  Patient has been very active, riding horses and working out on a regular basis.  Medications patient has been prescribed: None  Taking:         Reviewed prior external information including notes and imaging from previsou exam, outside providers and external EMR if available.   As well as notes that were available from care everywhere and other healthcare systems.  Past medical history, social, surgical and family history all reviewed in electronic medical record.  No pertanent information unless stated regarding to the chief complaint.   Past Medical History:  Diagnosis Date   Abnormal Pap smear 2008   LGSIL   Arthritis of left hip    saw Dr. [name], had MRI/had sx to fix-   Headache    Osteopenia    Seasonal allergies     Allergies  Allergen Reactions   Vicks Nyquil Cold & Flu Night [Dm-Apap-Cpm] Other (See Comments)    Heavy, tight chest     Objective  Blood pressure 118/82, pulse 89, height 5' (1.524 m), weight 136 lb (61.7 kg), SpO2 97 %.   General: No apparent distress alert and oriented x3 mood and affect normal, dressed  appropriately.  HEENT: Pupils equal, extraocular movements intact  Respiratory: Patient's speak in full sentences and does not appear short of breath  Cardiovascular: No lower extremity edema, non tender, no erythema  Low back exam does have some tightness noted.  Tenderness to palpation very mild tightness overall.  Osteopathic findings  T9 extended rotated and side bent left L2 flexed rotated and side bent right Sacrum right on right       Assessment and Plan:  Low back pain Low back exam is doing very well at the moment.  Responding extremely well to osteopathic manipulation.  Follow-up again in 6 to 8 weeks.  No changes   Nonallopathic problems  Decision today to treat with OMT was based on Physical Exam  After verbal consent patient was treated with HVLA, ME, FPR techniques in  thoracic, lumbar, and sacral  areas  Patient tolerated the procedure well with improvement in symptoms  Patient given exercises, stretches and lifestyle modifications  See medications in patient instructions if given  Patient will follow up in 4-8 weeks      The above documentation has been reviewed and is accurate and complete Lyndal Pulley, DO        Note: This dictation was prepared with Dragon dictation along with smaller phrase technology. Any transcriptional errors that result from this process are unintentional.

## 2021-09-30 ENCOUNTER — Ambulatory Visit: Payer: 59 | Admitting: Family Medicine

## 2021-09-30 ENCOUNTER — Other Ambulatory Visit: Payer: Self-pay

## 2021-09-30 VITALS — BP 118/82 | HR 89 | Ht 60.0 in | Wt 136.0 lb

## 2021-09-30 DIAGNOSIS — M545 Low back pain, unspecified: Secondary | ICD-10-CM

## 2021-09-30 DIAGNOSIS — M9902 Segmental and somatic dysfunction of thoracic region: Secondary | ICD-10-CM

## 2021-09-30 DIAGNOSIS — M9904 Segmental and somatic dysfunction of sacral region: Secondary | ICD-10-CM

## 2021-09-30 DIAGNOSIS — M9903 Segmental and somatic dysfunction of lumbar region: Secondary | ICD-10-CM

## 2021-09-30 DIAGNOSIS — G8929 Other chronic pain: Secondary | ICD-10-CM

## 2021-09-30 NOTE — Patient Instructions (Signed)
See me in 6 weeks if better see me in 2-3 months

## 2021-09-30 NOTE — Assessment & Plan Note (Signed)
Low back exam is doing very well at the moment.  Responding extremely well to osteopathic manipulation.  Follow-up again in 6 to 8 weeks.  No changes

## 2021-11-17 ENCOUNTER — Ambulatory Visit: Payer: 59 | Admitting: Family Medicine

## 2021-12-28 NOTE — Progress Notes (Unsigned)
  [place] Phone: [phone] Subjective:   Andrea [name], am serving as a scribe for Dr. Hulan Saas.   I'm seeing this patient by the request  of:  Luetta Nutting, DO  CC: Back and neck pain follow-up  [id number]  Andrea [name] is a 51 y.o. female coming in with complaint of back and neck pain. OMT 09/30/2021. Patient states that she has been feeling well.   Medications patient has been prescribed: none  Taking:         Reviewed prior external information including notes and imaging from previsou exam, outside providers and external EMR if available.   As well as notes that were available from care everywhere and other healthcare systems.  Past medical history, social, surgical and family history all reviewed in electronic medical record.  [name] pertanent information unless stated regarding to the chief complaint.   Past Medical History:  Diagnosis Date   Abnormal Pap smear 2008   LGSIL   Arthritis of left hip    saw Dr. [name], had MRI/had sx to fix-   Headache    Osteopenia    Seasonal allergies     Allergies  Allergen Reactions   Vicks Nyquil Cold & Flu Night [Dm-Apap-Cpm] Other (See Comments)    Heavy, tight chest     Review of Systems:  [name], visual changes, nausea, vomiting, diarrhea, constipation, dizziness, abdominal pain, skin rash, fevers, chills, night sweats, weight loss, swollen lymph nodes, body aches, joint swelling, chest pain, shortness of breath, mood changes. POSITIVE muscle aches  Objective  Blood pressure 112/72, pulse 74, height 5' (1.524 m), weight 136 lb (61.7 kg), SpO2 98 %.   General: [name] apparent distress alert and oriented x3 mood and affect normal, dressed appropriately.  HEENT: Pupils equal, extraocular movements intact  Respiratory: Patient's speak in full sentences and does not appear short of breath  Cardiovascular: [name] lower extremity edema,  non tender, [name] erythema  Back exam does have some loss of lordosis.  Some tenderness to palpation.  Osteopathic findings  C2 flexed rotated and side bent right T9 extended rotated and side bent left L2 flexed rotated and side bent right Sacrum right on right       Assessment and Plan:  Low back pain Overall relatively stable.  Still responding to osteopathic manipulation.  Follow-up again in 6 to 8 weeks    Nonallopathic problems  Decision today to treat with OMT was based on Physical Exam  After verbal consent patient was treated with HVLA, ME, FPR techniques in cervical,  thoracic, lumbar, and sacral  areas  Patient tolerated the procedure well with improvement in symptoms  Patient given exercises, stretches and lifestyle modifications  See medications in patient instructions if given  Patient will follow up in 6-8 weeks      The above documentation has been reviewed and is accurate and complete Lyndal Pulley, DO        Note: This dictation was prepared with Dragon dictation along with smaller phrase technology. Any transcriptional errors that result from this process are unintentional.

## 2021-12-29 ENCOUNTER — Ambulatory Visit: Payer: 59 | Admitting: Family Medicine

## 2021-12-29 VITALS — BP 112/72 | HR 74 | Ht 60.0 in | Wt 136.0 lb

## 2021-12-29 DIAGNOSIS — M9904 Segmental and somatic dysfunction of sacral region: Secondary | ICD-10-CM | POA: Diagnosis not present

## 2021-12-29 DIAGNOSIS — M9903 Segmental and somatic dysfunction of lumbar region: Secondary | ICD-10-CM

## 2021-12-29 DIAGNOSIS — G8929 Other chronic pain: Secondary | ICD-10-CM

## 2021-12-29 DIAGNOSIS — M545 Low back pain, unspecified: Secondary | ICD-10-CM | POA: Diagnosis not present

## 2021-12-29 DIAGNOSIS — M9902 Segmental and somatic dysfunction of thoracic region: Secondary | ICD-10-CM

## 2021-12-29 NOTE — Assessment & Plan Note (Signed)
Overall relatively stable.  Still responding to osteopathic manipulation.  Follow-up again in 6 to 8 weeks

## 2021-12-29 NOTE — Patient Instructions (Signed)
Theragun to scapula See me in 6 weeks

## 2022-01-13 NOTE — Progress Notes (Deleted)
  [place] Phone: [phone] Subjective:    I'm seeing this patient by the request  of:  Luetta Nutting, DO  CC:   [id number]  Andrea [name] is a 51 y.o. female coming in with complaint of back and neck pain. OMT 12/29/2021. Patient states   Medications patient has been prescribed: None  Taking:         Reviewed prior external information including notes and imaging from previsou exam, outside providers and external EMR if available.   As well as notes that were available from care everywhere and other healthcare systems.  Past medical history, social, surgical and family history all reviewed in electronic medical record.  No pertanent information unless stated regarding to the chief complaint.   Past Medical History:  Diagnosis Date   Abnormal Pap smear 2008   LGSIL   Arthritis of left hip    saw Dr. [name], had MRI/had sx to fix-   Headache    Osteopenia    Seasonal allergies     Allergies  Allergen Reactions   Vicks Nyquil Cold & Flu Night [Dm-Apap-Cpm] Other (See Comments)    Heavy, tight chest     Review of Systems:  No headache, visual changes, nausea, vomiting, diarrhea, constipation, dizziness, abdominal pain, skin rash, fevers, chills, night sweats, weight loss, swollen lymph nodes, body aches, joint swelling, chest pain, shortness of breath, mood changes. POSITIVE muscle aches  Objective  There were no vitals taken for this visit.   General: No apparent distress alert and oriented x3 mood and affect normal, dressed appropriately.  HEENT: Pupils equal, extraocular movements intact  Respiratory: Patient's speak in full sentences and does not appear short of breath  Cardiovascular: No lower extremity edema, non tender, no erythema  Neuro: Cranial nerves II through XII are intact, neurovascularly intact in all extremities with 2+ DTRs and 2+ pulses.  Gait normal with good  balance and coordination.  MSK:  Non tender with full range of motion and good stability and symmetric strength and tone of shoulders, elbows, wrist, hip, knee and ankles bilaterally.  Back - Normal skin, Spine with normal alignment and no deformity.  No tenderness to vertebral process palpation.  Paraspinous muscles are not tender and without spasm.   Range of motion is full at neck and lumbar sacral regions  Osteopathic findings  C2 flexed rotated and side bent right C6 flexed rotated and side bent left T3 extended rotated and side bent right inhaled rib T9 extended rotated and side bent left L2 flexed rotated and side bent right Sacrum right on right       Assessment and Plan:    Nonallopathic problems  Decision today to treat with OMT was based on Physical Exam  After verbal consent patient was treated with HVLA, ME, FPR techniques in cervical, rib, thoracic, lumbar, and sacral  areas  Patient tolerated the procedure well with improvement in symptoms  Patient given exercises, stretches and lifestyle modifications  See medications in patient instructions if given  Patient will follow up in 4-8 weeks      The above documentation has been reviewed and is accurate and complete Jacqualin Combes       Note: This dictation was prepared with Dragon dictation along with smaller phrase technology. Any transcriptional errors that result from this process are unintentional.

## 2022-01-18 ENCOUNTER — Ambulatory Visit: Payer: 59 | Admitting: Family Medicine

## 2022-02-16 NOTE — Progress Notes (Unsigned)
  [place] Phone: [phone] Subjective:   [name], am serving as a scribe for Dr. Hulan Saas.  I'm seeing this patient by the request  of:  Luetta Nutting, DO  CC: Back and neck pain  [id number]  Andrea [name] is a 51 y.o. female coming in with complaint of back and neck pain. OMT 12/29/2021.Patient states that she is doing well. Patient feels like erectors are tight especially on R side of thoracic spine. Also notes click in shoulder.  Patient states nothing severe overall.  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  Feels like that could have been contributing somewhat.  Medications patient has been prescribed: None  Taking:        Past Medical History:  Diagnosis Date   Abnormal Pap smear 2008   LGSIL   Arthritis of left hip    saw Dr. [name], had MRI/had sx to fix-   Headache    Osteopenia    Seasonal allergies     Allergies  Allergen Reactions   Vicks Nyquil Cold & Flu Night [Dm-Apap-Cpm] Other (See Comments)    Heavy, tight chest     Review of Systems:  No headache, visual changes, nausea, vomiting, diarrhea, constipation, dizziness, abdominal pain, skin rash, fevers, chills, night sweats, weight loss, swollen lymph nodes, body aches, joint swelling, chest pain, shortness of breath, mood changes. POSITIVE muscle aches  Objective  Blood pressure 108/72, temperature (!) 82 F (27.8 C), height 5' (1.524 m), SpO2 97 %.   General: No apparent distress alert and oriented x3 mood and affect normal, dressed appropriately.  HEENT: Pupils equal, extraocular movements intact  Respiratory: Patient's speak in full sentences and does not appear short of breath  Cardiovascular: No lower extremity edema, non tender, no erythema  Gait normal MSK:  Back mild tightness noted more in the paraspinal musculature lumbar spine right greater than  left.  Mild positive FABER test right greater than left.  Osteopathic findings T8 extended rotated and side bent left L1 flexed rotated and side bent right Sacrum right on right       Assessment and Plan:  Low back pain Chronic, stable.  Patient has been doing things she lives including working with horses on a more regular basis.  Discussed posture and ergonomics, discussed which activities to do and which ones to avoid.  Follow-up again 6 to 8 weeks.    Nonallopathic problems  Decision today to treat with OMT was based on Physical Exam  After verbal consent patient was treated with HVLA, ME, FPR techniques in cervical, rib, thoracic, lumbar, and sacral  areas  Patient tolerated the procedure well with improvement in symptoms  Patient given exercises, stretches and lifestyle modifications  See medications in patient instructions if given  Patient will follow up in 4-8 weeks    The above documentation has been reviewed and is accurate and complete Andrea Pulley, DO          Note: This dictation was prepared with Dragon dictation along with smaller phrase technology. Any transcriptional errors that result from this process are unintentional.

## 2022-02-17 ENCOUNTER — Ambulatory Visit: Payer: 59 | Admitting: Family Medicine

## 2022-02-17 ENCOUNTER — Encounter: Payer: Self-pay | Admitting: Family Medicine

## 2022-02-17 VITALS — BP 108/72 | Temp 82.0°F | Ht 60.0 in

## 2022-02-17 DIAGNOSIS — G8929 Other chronic pain: Secondary | ICD-10-CM

## 2022-02-17 DIAGNOSIS — M9902 Segmental and somatic dysfunction of thoracic region: Secondary | ICD-10-CM | POA: Diagnosis not present

## 2022-02-17 DIAGNOSIS — M545 Low back pain, unspecified: Secondary | ICD-10-CM

## 2022-02-17 DIAGNOSIS — M9904 Segmental and somatic dysfunction of sacral region: Secondary | ICD-10-CM

## 2022-02-17 DIAGNOSIS — M9903 Segmental and somatic dysfunction of lumbar region: Secondary | ICD-10-CM | POA: Diagnosis not present

## 2022-02-17 NOTE — Assessment & Plan Note (Signed)
Chronic, stable.  Patient has been doing things she lives including working with horses on a more regular basis.  Discussed posture and ergonomics, discussed which activities to do and which ones to avoid.  Follow-up again 6 to 8 weeks.

## 2022-02-17 NOTE — Patient Instructions (Signed)
Good to see you! See you again in 6-8 weeks

## 2022-03-30 NOTE — Progress Notes (Unsigned)
  Andrea [name] [address] [place] Phone: [phone] Subjective:   IVilma [name], am serving as a scribe for Dr. Hulan Saas.  I'm seeing this patient by the request  of:  Luetta Nutting, DO  CC: Low back pain follow-up  [id number]  Andrea [name] is a 51 y.o. female coming in with complaint of back and neck pain. OMT on 02/17/2022. Patient states doing well. Regular aches and pains. No new issues.  Medications patient has been prescribed: None  Taking:        Past Medical History:  Diagnosis Date   Abnormal Pap smear 2008   LGSIL   Arthritis of left hip    saw Dr. [name], had MRI/had sx to fix-   Headache    Osteopenia    Seasonal allergies     Allergies  Allergen Reactions   Vicks Nyquil Cold & Flu Night [Dm-Apap-Cpm] Other (See Comments)    Heavy, tight chest     Review of Systems:  No headache, visual changes, nausea, vomiting, diarrhea, constipation, dizziness, abdominal pain, skin rash, fevers, chills, night sweats, weight loss, swollen lymph nodes, body aches, joint swelling, chest pain, shortness of breath, mood changes. POSITIVE muscle aches  Objective  Blood pressure 104/68, pulse 68, height 5' (1.524 m), weight 133 lb (60.3 kg), SpO2 95 %.   General: No apparent distress alert and oriented x3 mood and affect normal, dressed appropriately.  HEENT: Pupils equal, extraocular movements intact  Respiratory: Patient's speak in full sentences and does not appear short of breath  Cardiovascular: No lower extremity edema, non tender, no erythema  Gait antalgic  MSK:  Back low back exam does have some tightness noted.  Seems to be left greater than right.  Different than patient's baseline.  Some tightness with FABER test on the left side as well.  Osteopathic findings  T8 extended rotated and side bent left L2 flexed rotated and side bent left  Sacrum right on right     Assessment and Plan:  Low  back pain Chronic tightness still noted but doing relatively well.  Mild exacerbation since falling off the horse itself.  Discussed with patient about doing the exercises.  Still nothing significantly concerning for long-term.  Follow-up again in 6 to 8 weeks otherwise.    Nonallopathic problems  Decision today to treat with OMT was based on Physical Exam  After verbal consent patient was treated with HVLA, ME, FPR techniques in thoracic, lumbar, and sacral  areas  Patient tolerated the procedure well with improvement in symptoms  Patient given exercises, stretches and lifestyle modifications  See medications in patient instructions if given  Patient will follow up in 6 weeks     The above documentation has been reviewed and is accurate and complete Lyndal Pulley, DO         Note: This dictation was prepared with Dragon dictation along with smaller phrase technology. Any transcriptional errors that result from this process are unintentional.

## 2022-03-31 ENCOUNTER — Ambulatory Visit: Payer: 59 | Admitting: Family Medicine

## 2022-03-31 VITALS — BP 104/68 | HR 68 | Ht 60.0 in | Wt 133.0 lb

## 2022-03-31 DIAGNOSIS — M9903 Segmental and somatic dysfunction of lumbar region: Secondary | ICD-10-CM | POA: Diagnosis not present

## 2022-03-31 DIAGNOSIS — M9904 Segmental and somatic dysfunction of sacral region: Secondary | ICD-10-CM | POA: Diagnosis not present

## 2022-03-31 DIAGNOSIS — M545 Low back pain, unspecified: Secondary | ICD-10-CM | POA: Diagnosis not present

## 2022-03-31 DIAGNOSIS — M9902 Segmental and somatic dysfunction of thoracic region: Secondary | ICD-10-CM | POA: Diagnosis not present

## 2022-03-31 DIAGNOSIS — G8929 Other chronic pain: Secondary | ICD-10-CM

## 2022-03-31 NOTE — Assessment & Plan Note (Signed)
Chronic tightness still noted but doing relatively well.  Mild exacerbation since falling off the horse itself.  Discussed with patient about doing the exercises.  Still nothing significantly concerning for long-term.  Follow-up again in 6 to 8 weeks otherwise.

## 2022-03-31 NOTE — Patient Instructions (Signed)
See me in 6 weeks

## 2022-05-02 IMAGING — US US EXTREM LOW VENOUS*L*
1 series · 14 of 24 positions shown · non-contrast
Comparison: None.

CLINICAL DATA: Left calf discomfort for 3 days. History of a left
hip replacement.

EXAM:
LEFT LOWER EXTREMITY VENOUS DOPPLER ULTRASOUND
TECHNIQUE: Gray-scale sonography with compression, as well as color and duplex
ultrasound, were performed to evaluate the deep venous system(s)
from the level of the common femoral vein through the popliteal and
proximal calf veins.

[Series 1: us extrem low venous*left* · 14 of 51 slices shown]
[im 1/51]
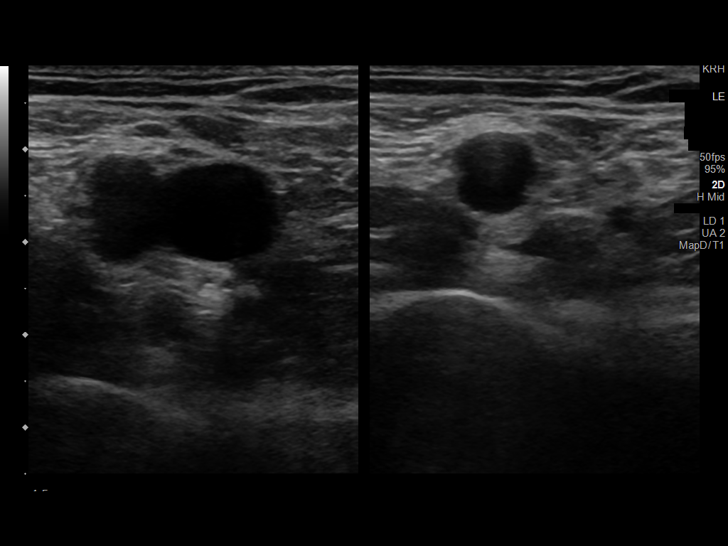
[im 5/51]
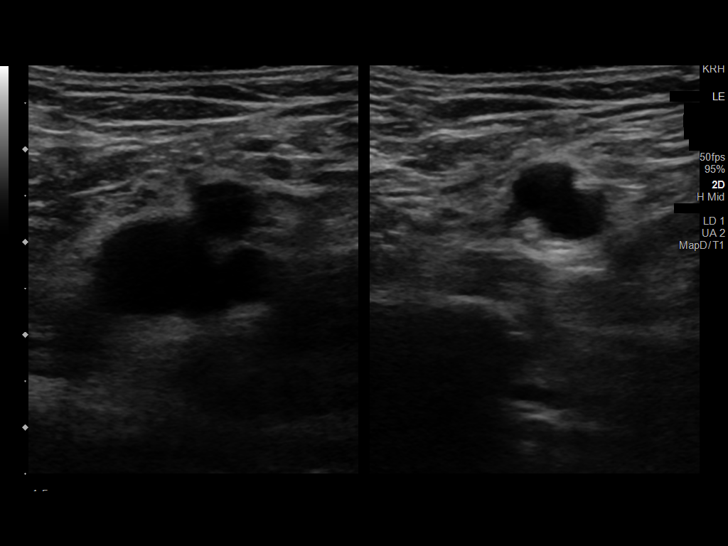
[im 9/51]
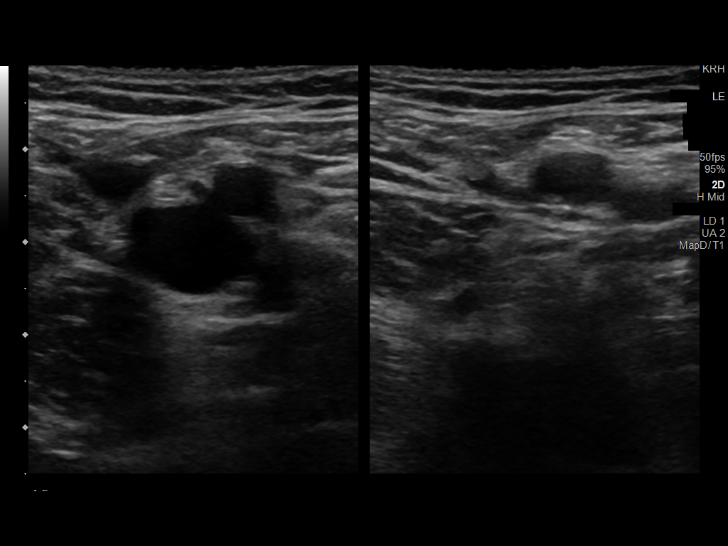
[im 14/51]
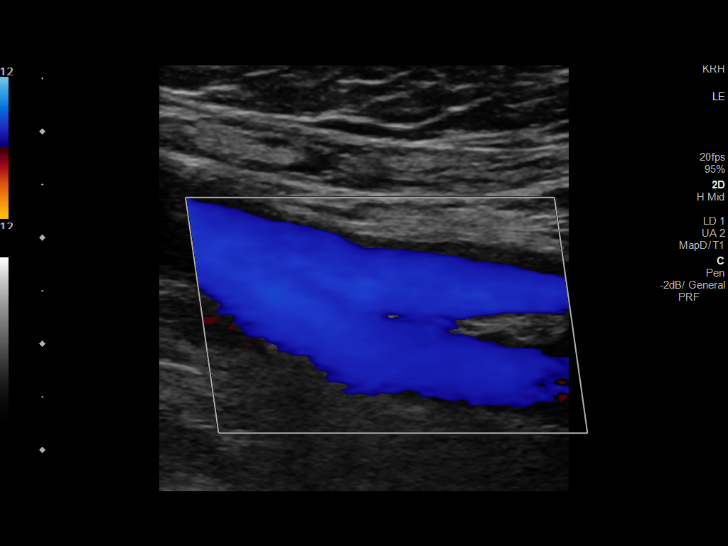
[im 16/51]
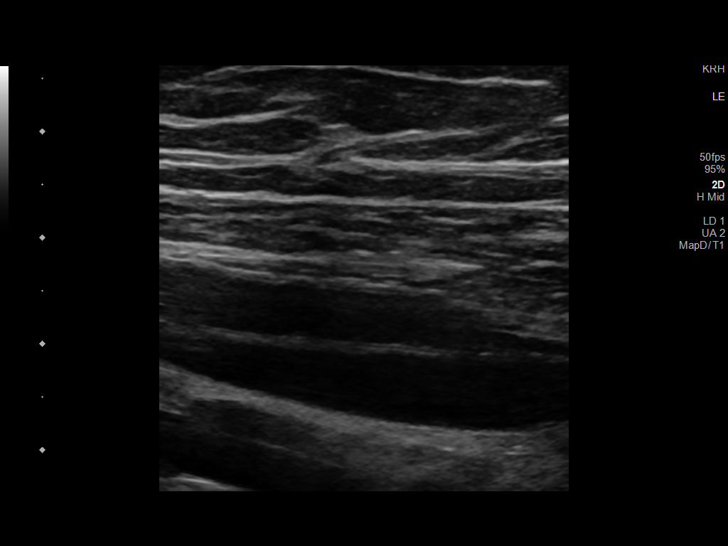
[im 20/51]
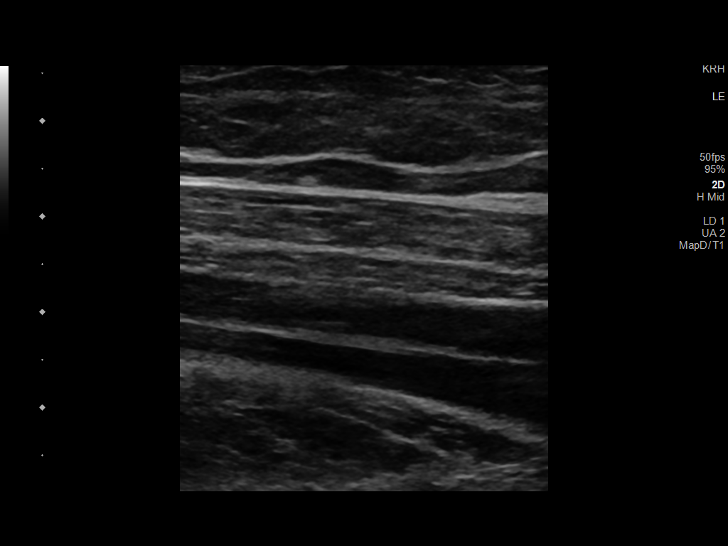
[im 24/51]
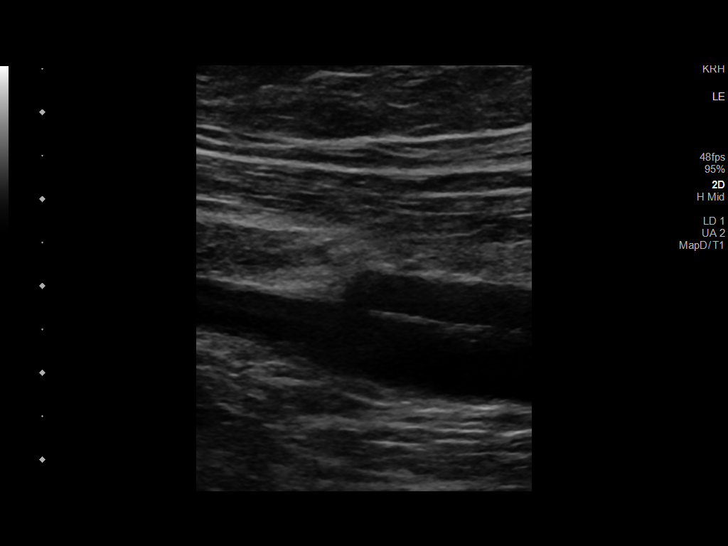
[im 27/51]
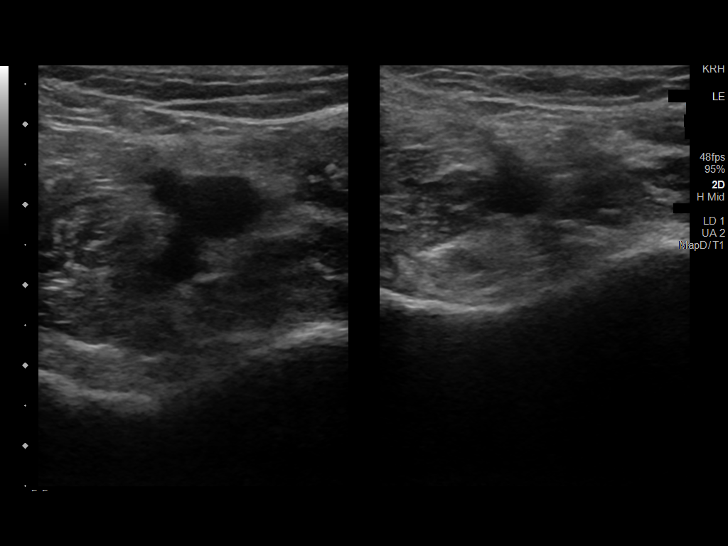
[im 31/51]
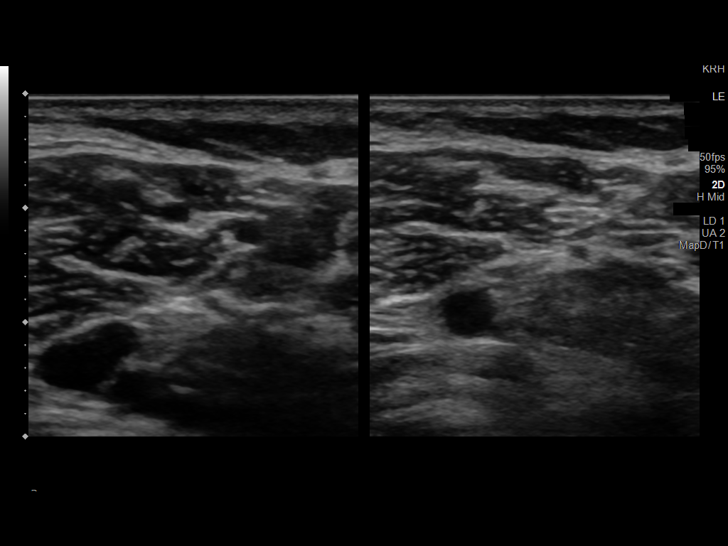
[im 35/51]
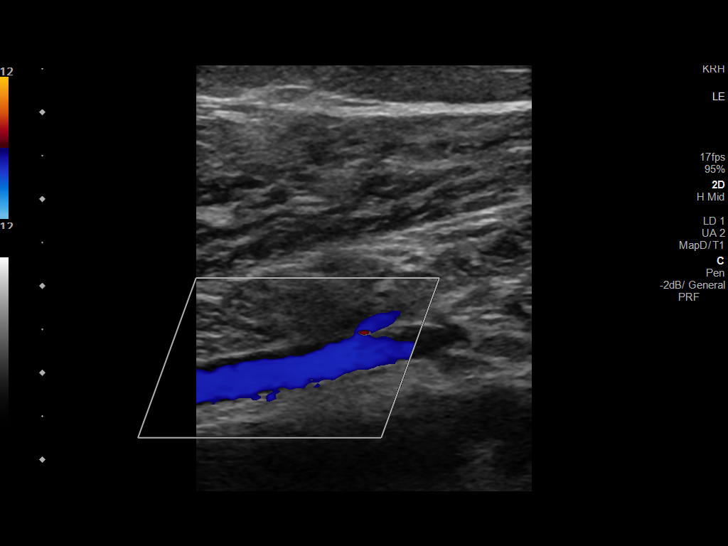
[im 40/51]
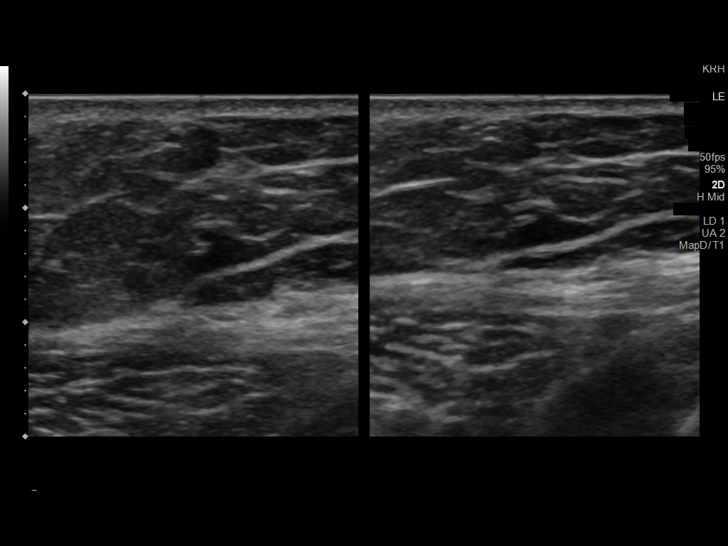
[im 42/51]
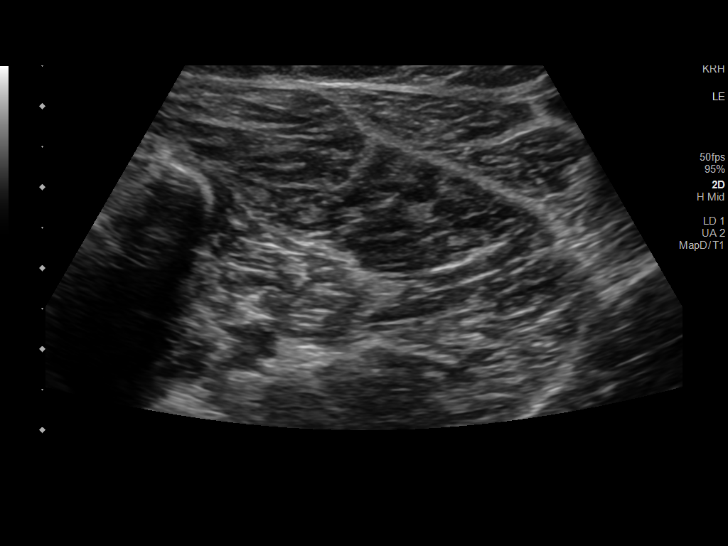
[im 46/51]
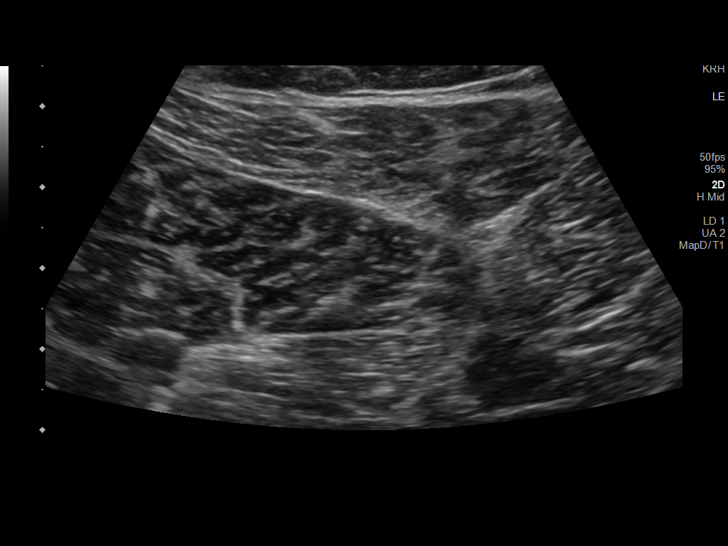
[im 51/51]
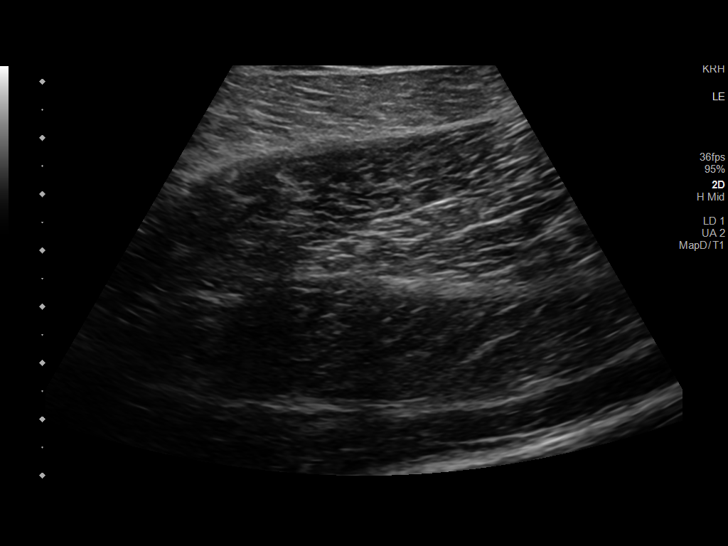

[14 of 24 positions shown; findings below may reference images not displayed]

FINDINGS: VENOUS

Normal compressibility of the common femoral, superficial femoral,
and popliteal veins, as well as the visualized calf veins.
Visualized portions of profunda femoral vein and great saphenous
vein unremarkable. No filling defects to suggest DVT on grayscale or
color Doppler imaging. Doppler waveforms show normal direction of
venous flow, normal respiratory plasticity and response to
augmentation.

Limited views of the contralateral common femoral vein are
unremarkable.

OTHER

None.

Limitations: none
IMPRESSION: Negative.

## 2022-05-12 NOTE — Progress Notes (Signed)
[place] Phone: [phone] Subjective:   [name], am serving as a scribe for Dr. Hulan Saas.  I'm seeing this patient by the request  of:  Luetta Nutting, DO  CC: Back pain follow-up  [id number]  Andrea [name] is a 51 y.o. female coming in with complaint of back and neck pain. OMT 03/31/2022. Patient states that she is having R scapula and cervical. No new injury.  Patient states that she continues to ride horses on a regular basis.  Feels like she has been doing relatively well but does have some increasing in tightness.  Nothing that is stopping her though from activity.  Medications patient has been prescribed: None  Taking:         Reviewed prior external information including notes and imaging from previsou exam, outside providers and external EMR if available.   As well as notes that were available from care everywhere and other healthcare systems.  Past medical history, social, surgical and family history all reviewed in electronic medical record.  No pertanent information unless stated regarding to the chief complaint.   Past Medical History:  Diagnosis Date   Abnormal Pap smear 2008   LGSIL   Arthritis of left hip    saw Dr. [name], had MRI/had sx to fix-   Headache    Osteopenia    Seasonal allergies     Allergies  Allergen Reactions   Vicks Nyquil Cold & Flu Night [Dm-Apap-Cpm] Other (See Comments)    Heavy, tight chest     Review of Systems:  No headache, visual changes, nausea, vomiting, diarrhea, constipation, dizziness, abdominal pain, skin rash, fevers, chills, night sweats, weight loss, swollen lymph nodes, body aches, joint swelling, chest pain, shortness of breath, mood changes. POSITIVE muscle aches  Objective  Blood pressure (!) 98/52, pulse 90, height 5' (1.524 m), weight 133 lb (60.3 kg), SpO2 97 %.   General: No apparent distress alert and oriented x3  mood and affect normal, dressed appropriately.  HEENT: Pupils equal, extraocular movements intact  Respiratory: Patient's speak in full sentences and does not appear short of breath  Cardiovascular: No lower extremity edema, non tender, no erythema    Osteopathic findings  C6 flexed rotated and side bent right  T3 extended rotated and side bent right inhaled rib T5 extended rotated and side bent left inhaled rib  L2 flexed rotated and side bent right Sacrum right on right       Assessment and Plan:  Low back pain Chronic problem but stable.  Doing much better overall I would say with patient being able to continue to increase activity.  Patient has been able to work out and work with the horses that does give her significant joy.  Follow-up with me again in 6 to 8 weeks.    Nonallopathic problems  Decision today to treat with OMT was based on Physical Exam  After verbal consent patient was treated with HVLA, ME, FPR techniques in cervical, rib, thoracic, lumbar, and sacral  areas  Patient tolerated the procedure well with improvement in symptoms  Patient given exercises, stretches and lifestyle modifications  See medications in patient instructions if given  Patient will follow up in 4-8 weeks     The above documentation has been reviewed and is accurate and complete Lyndal Pulley, DO         Note: This dictation was prepared with Dragon dictation along with  smaller phrase technology. Any transcriptional errors that result from this process are unintentional.

## 2022-05-13 ENCOUNTER — Ambulatory Visit: Payer: 59 | Admitting: Family Medicine

## 2022-05-13 ENCOUNTER — Encounter: Payer: Self-pay | Admitting: Family Medicine

## 2022-05-13 VITALS — BP 98/52 | HR 90 | Ht 60.0 in | Wt 133.0 lb

## 2022-05-13 DIAGNOSIS — M9901 Segmental and somatic dysfunction of cervical region: Secondary | ICD-10-CM | POA: Diagnosis not present

## 2022-05-13 DIAGNOSIS — M9902 Segmental and somatic dysfunction of thoracic region: Secondary | ICD-10-CM

## 2022-05-13 DIAGNOSIS — M545 Low back pain, unspecified: Secondary | ICD-10-CM

## 2022-05-13 DIAGNOSIS — G8929 Other chronic pain: Secondary | ICD-10-CM

## 2022-05-13 DIAGNOSIS — M9908 Segmental and somatic dysfunction of rib cage: Secondary | ICD-10-CM

## 2022-05-13 DIAGNOSIS — M9904 Segmental and somatic dysfunction of sacral region: Secondary | ICD-10-CM

## 2022-05-13 DIAGNOSIS — M9903 Segmental and somatic dysfunction of lumbar region: Secondary | ICD-10-CM

## 2022-05-13 NOTE — Assessment & Plan Note (Signed)
Chronic problem but stable.  Doing much better overall I would say with patient being able to continue to increase activity.  Patient has been able to work out and work with the horses that does give her significant joy.  Follow-up with me again in 6 to 8 weeks.

## 2022-05-13 NOTE — Patient Instructions (Signed)
Good to see you! We can see hubby in next 2 weeks Go grocery shopping

## 2022-06-24 ENCOUNTER — Ambulatory Visit: Payer: 59 | Admitting: Family Medicine

## 2022-07-05 NOTE — Progress Notes (Unsigned)
[place] Phone: [phone] Subjective:   [name], am serving as a scribe for Dr. Hulan Saas.  I'm seeing this patient by the request  of:  Luetta Nutting, DO  CC: Back and neck pain follow-up  [id number]  Andrea [name] is a 51 y.o. female coming in with complaint of back and neck pain. OMT 06/13/2022. Patient states that she is doing well.   Caught hand in reigns and is having middle finger pain in R hand over PIP joint. Unable to close fist.   Medications patient has been prescribed: None  Taking:         Reviewed prior external information including notes and imaging from previsou exam, outside providers and external EMR if available.   As well as notes that were available from care everywhere and other healthcare systems.  Past medical history, social, surgical and family history all reviewed in electronic medical record.  No pertanent information unless stated regarding to the chief complaint.   Past Medical History:  Diagnosis Date   Abnormal Pap smear 2008   LGSIL   Arthritis of left hip    saw Dr. [name], had MRI/had sx to fix-   Headache    Osteopenia    Seasonal allergies     Allergies  Allergen Reactions   Vicks Nyquil Cold & Flu Night [Dm-Apap-Cpm] Other (See Comments)    Heavy, tight chest     Review of Systems:  No headache, visual changes, nausea, vomiting, diarrhea, constipation, dizziness, abdominal pain, skin rash, fevers, chills, night sweats, weight loss, swollen lymph nodes, body aches, joint swelling, chest pain, shortness of breath, mood changes. POSITIVE muscle aches  Objective  Blood pressure 102/68, pulse 78, height 5' (1.524 m), weight 137 lb (62.1 kg), SpO2 98 %.   General: No apparent distress alert and oriented x3 mood and affect normal, dressed appropriately.  HEENT: Pupils equal, extraocular movements intact  Respiratory: Patient's speak in  full sentences and does not appear short of breath  Cardiovascular: No lower extremity edema, non tender, no erythema  Back pain does have some mild loss of lordosis.  Tightness noted in the FABER test more than usual at the moment.  Patient does have pain over the pubic symphysis as well today.  Osteopathic findings  C3 flexed rotated and side bent right C5 flexed rotated and side bent left T3 extended rotated and side bent right inhaled rib T9 extended rotated and side bent left L2 flexed rotated and side bent right Sacrum right on right  Patient's finger does have what appears to be a mild subluxation noted.  Some mild swelling just proximal to the DIP of the middle finger.  With manipulation a audible pop happened and improvement in range of motion.  Limited muscular skeletal ultrasound was performed and interpreted by Hulan Saas, M  Limited ultrasound shows that patient does have some hypoechoic changes that is consistent with a small joint effusion of the DIP and the PIP.  No cortical irregularity noted. Impression: Synovitis or effusion of the DIP and PIP of the middle finger on the right hand     Assessment and Plan:  Low back pain Low back exam does have some loss of lordosis.  Patient does have some tightness noted of the hip musculature bilaterally more than usual today.  We discussed with patient to stay on the horse instead of following off-hours.  Discussed continuing to stay active otherwise.  Subluxation of distal interphalangeal (DIP) joint of right middle finger Will subluxation occur after fall.  Discussed buddy taping, did have a lateral plate likely subluxation and did have good results with the manipulation.  Patient will monitor.  If any worsening symptoms to come seek attention immediately.  Follow-up again in 6 to 8 weeks.    Nonallopathic problems  Decision today to treat with OMT was based on Physical Exam  After verbal consent patient was treated with  HVLA, ME, FPR techniques in cervical, rib, thoracic, lumbar, and sacral  areas  Patient tolerated the procedure well with improvement in symptoms  Patient given exercises, stretches and lifestyle modifications  See medications in patient instructions if given  Patient will follow up in 4-8 weeks    The above documentation has been reviewed and is accurate and complete Andrea Pulley, DO          Note: This dictation was prepared with Dragon dictation along with smaller phrase technology. Any transcriptional errors that result from this process are unintentional.

## 2022-07-07 ENCOUNTER — Ambulatory Visit: Payer: 59 | Admitting: Family Medicine

## 2022-07-07 ENCOUNTER — Ambulatory Visit: Payer: Self-pay

## 2022-07-07 VITALS — BP 102/68 | HR 78 | Ht 60.0 in | Wt 137.0 lb

## 2022-07-07 DIAGNOSIS — S63242A Subluxation of distal interphalangeal joint of right middle finger, initial encounter: Secondary | ICD-10-CM | POA: Diagnosis not present

## 2022-07-07 DIAGNOSIS — M9904 Segmental and somatic dysfunction of sacral region: Secondary | ICD-10-CM | POA: Diagnosis not present

## 2022-07-07 DIAGNOSIS — M9902 Segmental and somatic dysfunction of thoracic region: Secondary | ICD-10-CM

## 2022-07-07 DIAGNOSIS — M79641 Pain in right hand: Secondary | ICD-10-CM | POA: Diagnosis not present

## 2022-07-07 DIAGNOSIS — G8929 Other chronic pain: Secondary | ICD-10-CM

## 2022-07-07 DIAGNOSIS — M9908 Segmental and somatic dysfunction of rib cage: Secondary | ICD-10-CM | POA: Diagnosis not present

## 2022-07-07 DIAGNOSIS — M545 Low back pain, unspecified: Secondary | ICD-10-CM | POA: Diagnosis not present

## 2022-07-07 DIAGNOSIS — M9903 Segmental and somatic dysfunction of lumbar region: Secondary | ICD-10-CM

## 2022-07-07 DIAGNOSIS — M9901 Segmental and somatic dysfunction of cervical region: Secondary | ICD-10-CM

## 2022-07-07 NOTE — Assessment & Plan Note (Signed)
Will subluxation occur after fall.  Discussed buddy taping, did have a lateral plate likely subluxation and did have good results with the manipulation.  Patient will monitor.  If any worsening symptoms to come seek attention immediately.  Follow-up again in 6 to 8 weeks.

## 2022-07-07 NOTE — Patient Instructions (Signed)
Use coban with a lot of activity See me in 6-8 weeks

## 2022-07-07 NOTE — Assessment & Plan Note (Signed)
Low back exam does have some loss of lordosis.  Patient does have some tightness noted of the hip musculature bilaterally more than usual today.  We discussed with patient to stay on the horse instead of following off-hours.  Discussed continuing to stay active otherwise.

## 2022-08-11 NOTE — Progress Notes (Deleted)
  [place] Phone: [phone] Subjective:    I'm seeing this patient by the request  of:  Luetta Nutting, DO  CC:   [id number]  Andrea [name] is a 52 y.o. female coming in with complaint of back and neck pain. OMT 07/07/2022. Also f/u for finger pain, R hand. Patient states   Medications patient has been prescribed: None  Taking:         Reviewed prior external information including notes and imaging from previsou exam, outside providers and external EMR if available.   As well as notes that were available from care everywhere and other healthcare systems.  Past medical history, social, surgical and family history all reviewed in electronic medical record.  No pertanent information unless stated regarding to the chief complaint.   Past Medical History:  Diagnosis Date   Abnormal Pap smear 2008   LGSIL   Arthritis of left hip    saw Dr. [name], had MRI/had sx to fix-   Headache    Osteopenia    Seasonal allergies     Allergies  Allergen Reactions   Vicks Nyquil Cold & Flu Night [Dm-Apap-Cpm] Other (See Comments)    Heavy, tight chest     Review of Systems:  No headache, visual changes, nausea, vomiting, diarrhea, constipation, dizziness, abdominal pain, skin rash, fevers, chills, night sweats, weight loss, swollen lymph nodes, body aches, joint swelling, chest pain, shortness of breath, mood changes. POSITIVE muscle aches  Objective  There were no vitals taken for this visit.   General: No apparent distress alert and oriented x3 mood and affect normal, dressed appropriately.  HEENT: Pupils equal, extraocular movements intact  Respiratory: Patient's speak in full sentences and does not appear short of breath  Cardiovascular: No lower extremity edema, non tender, no erythema  Gait MSK:  Back   Osteopathic findings  C2 flexed rotated and side bent right C6 flexed rotated and side  bent left T3 extended rotated and side bent right inhaled rib T9 extended rotated and side bent left L2 flexed rotated and side bent right Sacrum right on right       Assessment and Plan:  No problem-specific Assessment & Plan notes found for this encounter.    Nonallopathic problems  Decision today to treat with OMT was based on Physical Exam  After verbal consent patient was treated with HVLA, ME, FPR techniques in cervical, rib, thoracic, lumbar, and sacral  areas  Patient tolerated the procedure well with improvement in symptoms  Patient given exercises, stretches and lifestyle modifications  See medications in patient instructions if given  Patient will follow up in 4-8 weeks             Note: This dictation was prepared with Dragon dictation along with smaller phrase technology. Any transcriptional errors that result from this process are unintentional.

## 2022-08-18 ENCOUNTER — Ambulatory Visit: Payer: 59 | Admitting: Family Medicine

## 2022-08-31 NOTE — Progress Notes (Signed)
[name] [address] [place] [address] Phone: [phone] Subjective:   Bruce Donath, am serving as a scribe for Dr. Antoine Primas.  I'm seeing this patient by the request  of:  Everrett Coombe, DO  CC: back and neck pain   [id number]  Andrea [name] is a 52 y.o. female coming in with complaint of back and neck pain. OMT on 07/07/2022. Also seen for DIP subluxation. Patient states that she had a back spasm last week on R side and she was unable to drive for 2 days. Massage helped to clear up symptoms. Feels back to normal this week.   R middle finger swelling continues. Painful in certain motions.   Medications patient has been prescribed:   Taking:         Reviewed prior external information including notes and imaging from previsou exam, outside providers and external EMR if available.   As well as notes that were available from care everywhere and other healthcare systems.  Past medical history, social, surgical and family history all reviewed in electronic medical record.  No pertanent information unless stated regarding to the chief complaint.   Past Medical History:  Diagnosis Date   Abnormal Pap smear 2008   LGSIL   Arthritis of left hip    saw Dr. Charlett Blake, had MRI/had sx to fix-   Headache    Osteopenia    Seasonal allergies     Allergies  Allergen Reactions   Vicks Nyquil Cold & Flu Night [Dm-Apap-Cpm] Other (See Comments)    Heavy, tight chest     Review of Systems:  No headache, visual changes, nausea, vomiting, diarrhea, constipation, dizziness, abdominal pain, skin rash, fevers, chills, night sweats, weight loss, swollen lymph nodes, body aches, joint swelling, chest pain, shortness of breath, mood changes. POSITIVE muscle aches  Objective  Blood pressure 102/70, pulse 77, height 5' (1.524 m), weight 138 lb (62.6 kg), SpO2 99 %.   General: No apparent distress alert and oriented x3 mood and affect  normal, dressed appropriately.  HEENT: Pupils equal, extraocular movements intact  Respiratory: Patient's speak in full sentences and does not appear short of breath  Cardiovascular: No lower extremity edema, non tender, no erythema  Gait MSK:  Back does have some loss of lordosis.  Some tenderness to palpation in the paraspinal musculature.  Patient does have tightness noted minorly with FABER test.  Patient does lack the last 15 degrees of extension.  Osteopathic findings   T7 extended rotated and side bent left L2 flexed rotated and side bent right Sacrum right on right     Assessment and Plan:  Low back pain Low back does have some loss of lordosis did have mild exacerbation noted as well discussed which activities to do and which ones to avoid.  Will continue to monitor.  Patient did have an episode where patient did have an exacerbation with muscle spasm but patient has completely resolved at this time.  Follow-up again in 6 weeks    Nonallopathic problems  Decision today to treat with OMT was based on Physical Exam  After verbal consent patient was treated with HVLA, ME, FPR techniques in , thoracic, lumbar, and sacral  areas  Patient tolerated the procedure well with improvement in symptoms  Patient given exercises, stretches and lifestyle modifications  See medications in patient instructions if given  Patient will follow up in 4-8 weeks    The above documentation has been reviewed and  is accurate and complete Judi Saa, DO          Note: This dictation was prepared with Dragon dictation along with smaller phrase technology. Any transcriptional errors that result from this process are unintentional.

## 2022-09-01 ENCOUNTER — Ambulatory Visit: Payer: 59 | Admitting: Family Medicine

## 2022-09-01 ENCOUNTER — Encounter: Payer: Self-pay | Admitting: Family Medicine

## 2022-09-01 VITALS — BP 102/70 | HR 77 | Ht 60.0 in | Wt 138.0 lb

## 2022-09-01 DIAGNOSIS — M545 Low back pain, unspecified: Secondary | ICD-10-CM | POA: Diagnosis not present

## 2022-09-01 DIAGNOSIS — M9903 Segmental and somatic dysfunction of lumbar region: Secondary | ICD-10-CM | POA: Diagnosis not present

## 2022-09-01 DIAGNOSIS — G8929 Other chronic pain: Secondary | ICD-10-CM

## 2022-09-01 DIAGNOSIS — M9904 Segmental and somatic dysfunction of sacral region: Secondary | ICD-10-CM

## 2022-09-01 DIAGNOSIS — M9902 Segmental and somatic dysfunction of thoracic region: Secondary | ICD-10-CM

## 2022-09-01 NOTE — Assessment & Plan Note (Signed)
Low back does have some loss of lordosis did have mild exacerbation noted as well discussed which activities to do and which ones to avoid.  Will continue to monitor.  Patient did have an episode where patient did have an exacerbation with muscle spasm but patient has completely resolved at this time.  Follow-up again in 6 weeks

## 2022-09-01 NOTE — Patient Instructions (Signed)
You are awesome Tell Fish his voice made your back spasm See me in 6 weeks

## 2022-09-28 NOTE — Progress Notes (Signed)
And[place] [name] Phone: [phone] Subjective:   [name], am serving as a scribe for Dr. Hulan Saas.  I'm seeing this patient by the request  of:  Luetta Nutting, DO  CC: Back and neck pain follow-up  [id number]  Andrea [name] is a 52 y.o. female coming in with complaint of back and neck pain. OTM 09/01/2022. Patient states that she has been doing well since last visit. Her finger has improved dramatically since last visit.   Medications patient has been prescribed: None  Taking:         Reviewed prior external information including notes and imaging from previsou exam, outside providers and external EMR if available.   As well as notes that were available from care everywhere and other healthcare systems.  Past medical history, social, surgical and family history all reviewed in electronic medical record.  No pertanent information unless stated regarding to the chief complaint.   Past Medical History:  Diagnosis Date   Abnormal Pap smear 2008   LGSIL   Arthritis of left hip    saw Dr. [name], had MRI/had sx to fix-   Headache    Osteopenia    Seasonal allergies     Allergies  Allergen Reactions   Vicks Nyquil Cold & Flu Night [Dm-Apap-Cpm] Other (See Comments)    Heavy, tight chest     Review of Systems:  No headache, visual changes, nausea, vomiting, diarrhea, constipation, dizziness, abdominal pain, skin rash, fevers, chills, night sweats, weight loss, swollen lymph nodes, body aches, joint swelling, chest pain, shortness of breath, mood changes. POSITIVE muscle aches  Objective  Blood pressure 104/76, pulse 82, height 5' (1.524 m), weight 136 lb (61.7 kg), SpO2 97 %.   General: No apparent distress alert and oriented x3 mood and affect normal, dressed appropriately.  HEENT: Pupils equal, extraocular movements intact  Respiratory: Patient's speak in full sentences and does not  appear short of breath  Cardiovascular: No lower extremity edema, non tender, no erythema  MSK:  Back low back does have some loss of lordosis.  Otherwise patient is doing relatively well.  Some tightness noted in the paraspinal musculature of the cervical spine right greater than left.  Osteopathic findings  C2 flexed rotated and side bent left C6 flexed rotated and side bent left T3 extended rotated and side bent right inhaled rib T9 extended rotated and side bent left L3 flexed rotated and side bent right Sacrum right on right       Assessment and Plan:  Low back pain Low back continues to have some tightness noted.  Patient was responding extremely well to osteopathic manipulation.  Patient is doing the home exercises regularly as well.  Discussed icing regimen and home exercises.  Follow-up again in 6 to 8 weeks    Nonallopathic problems  Decision today to treat with OMT was based on Physical Exam  After verbal consent patient was treated with HVLA, ME, FPR techniques in cervical, rib, thoracic, lumbar, and sacral  areas  Patient tolerated the procedure well with improvement in symptoms  Patient given exercises, stretches and lifestyle modifications  See medications in patient instructions if given  Patient will follow up in 4-8 weeks    The above documentation has been reviewed and is accurate and complete Lyndal Pulley, DO          Note: This dictation was prepared with Dragon dictation along with smaller phrase  technology. Any transcriptional errors that result from this process are unintentional.

## 2022-09-29 ENCOUNTER — Ambulatory Visit: Payer: 59 | Admitting: Family Medicine

## 2022-09-29 VITALS — BP 104/76 | HR 82 | Ht 60.0 in | Wt 136.0 lb

## 2022-09-29 DIAGNOSIS — M9904 Segmental and somatic dysfunction of sacral region: Secondary | ICD-10-CM | POA: Diagnosis not present

## 2022-09-29 DIAGNOSIS — M9903 Segmental and somatic dysfunction of lumbar region: Secondary | ICD-10-CM | POA: Diagnosis not present

## 2022-09-29 DIAGNOSIS — M9901 Segmental and somatic dysfunction of cervical region: Secondary | ICD-10-CM

## 2022-09-29 DIAGNOSIS — G8929 Other chronic pain: Secondary | ICD-10-CM

## 2022-09-29 DIAGNOSIS — M545 Low back pain, unspecified: Secondary | ICD-10-CM | POA: Diagnosis not present

## 2022-09-29 DIAGNOSIS — M9908 Segmental and somatic dysfunction of rib cage: Secondary | ICD-10-CM | POA: Diagnosis not present

## 2022-09-29 DIAGNOSIS — M9902 Segmental and somatic dysfunction of thoracic region: Secondary | ICD-10-CM | POA: Diagnosis not present

## 2022-09-29 NOTE — Assessment & Plan Note (Signed)
Low back continues to have some tightness noted.  Patient was responding extremely well to osteopathic manipulation.  Patient is doing the home exercises regularly as well.  Discussed icing regimen and home exercises.  Follow-up again in 6 to 8 weeks

## 2022-09-29 NOTE — Patient Instructions (Signed)
Great to see you Good luck with birthday boy See me in 5-6 weeks

## 2022-11-09 NOTE — Progress Notes (Signed)
[name] [address] [place] [address] Phone: [phone] Subjective:   Andrea [name], am serving as a scribe for Dr. Antoine [name].  I'm seeing this patient by the request  of:  Everrett Coombe, DO  CC: Back and neck pain follow-up  [id number]  Andrea [name] is a 52 y.o. female coming in with complaint of back and neck pain. OTM 09/29/2022. Patient states that she is doing ok. Patient states that lower back is tight. Had to use TENS and heat recently but today is feeling ok.   Medications patient has been prescribed: None  Taking:         Reviewed prior external information including notes and imaging from previsou exam, outside providers and external EMR if available.   As well as notes that were available from care everywhere and other healthcare systems.  Past medical history, social, surgical and family history all reviewed in electronic medical record.  No pertanent information unless stated regarding to the chief complaint.   Past Medical History:  Diagnosis Date   Abnormal Pap smear 2008   LGSIL   Arthritis of left hip    saw Dr. Charlett Blake, had MRI/had sx to fix-   Headache    Osteopenia    Seasonal allergies     Allergies  Allergen Reactions   Vicks Nyquil Cold & Flu Night [Dm-Apap-Cpm] Other (See Comments)    Heavy, tight chest     Review of Systems:  No headache, visual changes, nausea, vomiting, diarrhea, constipation, dizziness, abdominal pain, skin rash, fevers, chills, night sweats, weight loss, swollen lymph nodes, body aches, joint swelling, chest pain, shortness of breath, mood changes. POSITIVE muscle aches  Objective  Blood pressure 102/70, pulse 68, height 5' (1.524 m), weight 134 lb (60.8 kg), SpO2 98 %.   General: No apparent distress alert and oriented x3 mood and affect normal, dressed appropriately.  HEENT: Pupils equal, extraocular movements intact  Respiratory: Patient's speak in full  sentences and does not appear short of breath  Cardiovascular: No lower extremity edema, non tender, no erythema  \ Low back exam does have some loss lordosis noted.  Some tenderness to palpation noted.  Tightness with FABER test noted.  Osteopathic findings  C4 flexed rotated and side bent left T4 extended rotated and side bent right inhaled rib T9 extended rotated and side bent left L2 flexed rotated and side bent right L4 flexed rotated and side bent right Sacrum right on right       Assessment and Plan:  Low back pain Some increase in tightness noted in the paraspinal musculature to the right.  Seems to be more on the right side of the paraspinal musculature.  Discussed with patient about potentially stretching afterwards.  Patient still stating.  Strong.  Is working significantly hard on gluteal strengthening hip abductor strength.  Follow-up again in 6 to 8 weeks    Nonallopathic problems  Decision today to treat with OMT was based on Physical Exam  After verbal consent patient was treated with HVLA, ME, FPR techniques in cervical, rib, thoracic, lumbar, and sacral  areas  Patient tolerated the procedure well with improvement in symptoms  Patient given exercises, stretches and lifestyle modifications  See medications in patient instructions if given  Patient will follow up in 4-8 weeks     The above documentation has been reviewed and is accurate and complete Judi Saa, DO         Note:  This dictation was prepared with Dragon dictation along with smaller phrase technology. Any transcriptional errors that result from this process are unintentional.

## 2022-11-10 ENCOUNTER — Encounter: Payer: Self-pay | Admitting: Family Medicine

## 2022-11-10 ENCOUNTER — Ambulatory Visit: Payer: 59 | Admitting: Family Medicine

## 2022-11-10 VITALS — BP 102/70 | HR 68 | Ht 60.0 in | Wt 134.0 lb

## 2022-11-10 DIAGNOSIS — M9902 Segmental and somatic dysfunction of thoracic region: Secondary | ICD-10-CM

## 2022-11-10 DIAGNOSIS — M9908 Segmental and somatic dysfunction of rib cage: Secondary | ICD-10-CM

## 2022-11-10 DIAGNOSIS — G8929 Other chronic pain: Secondary | ICD-10-CM

## 2022-11-10 DIAGNOSIS — M545 Low back pain, unspecified: Secondary | ICD-10-CM | POA: Diagnosis not present

## 2022-11-10 DIAGNOSIS — M9901 Segmental and somatic dysfunction of cervical region: Secondary | ICD-10-CM | POA: Diagnosis not present

## 2022-11-10 DIAGNOSIS — M9904 Segmental and somatic dysfunction of sacral region: Secondary | ICD-10-CM

## 2022-11-10 DIAGNOSIS — M9903 Segmental and somatic dysfunction of lumbar region: Secondary | ICD-10-CM | POA: Diagnosis not present

## 2022-11-10 NOTE — Assessment & Plan Note (Signed)
Some increase in tightness noted in the paraspinal musculature to the right.  Seems to be more on the right side of the paraspinal musculature.  Discussed with patient about potentially stretching afterwards.  Patient still stating.  Strong.  Is working significantly hard on gluteal strengthening hip abductor strength.  Follow-up again in 6 to 8 weeks

## 2022-11-10 NOTE — Patient Instructions (Signed)
See me in 6-8 weeks 

## 2022-12-21 NOTE — Progress Notes (Unsigned)
  [name] [address] [place] [address] Phone: [phone] Subjective:   Andrea [name], am serving as a scribe for Dr. Antoine Primas.  I'm seeing this patient by the request  of:  Everrett Coombe, DO  CC: Low back pain follow-up  [id number]  Andrea [name] is a 52 y.o. female coming in with complaint of back and neck pain. OMT 11/10/2022. Patient states that she is doing well. L shoulder is clicking. Feels like a rib is out.   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   Medications patient has been prescribed: None  Taking:         Reviewed prior external information including notes and imaging from previsou exam, outside providers and external EMR if available.   As well as notes that were available from care everywhere and other healthcare systems.  Past medical history, social, surgical and family history all reviewed in electronic medical record.  No pertanent information unless stated regarding to the chief complaint.   Past Medical History:  Diagnosis Date   Abnormal Pap smear 2008   LGSIL   Arthritis of left hip    saw Dr. Charlett Blake, had MRI/had sx to fix-   Headache    Osteopenia    Seasonal allergies     Allergies  Allergen Reactions   Vicks Nyquil Cold & Flu Night [Dm-Apap-Cpm] Other (See Comments)    Heavy, tight chest     Review of Systems:  No headache, visual changes, nausea, vomiting, diarrhea, constipation, dizziness, abdominal pain, skin rash, fevers, chills, night sweats, weight loss, swollen lymph nodes, body aches, joint swelling, chest pain, shortness of breath, mood changes. POSITIVE muscle aches  Objective  Blood pressure 110/82, pulse 72, height 5' (1.524 m), weight 135 lb (61.2 kg), SpO2 96 %.   General: No apparent distress alert and oriented x3 mood and affect normal, dressed appropriately.  HEENT: Pupils equal, extraocular movements intact  Respiratory: Patient's speak in full  sentences and does not appear short of breath  Cardiovascular: No lower extremity edema, non tender, no erythema  Low back exam does have some loss of lordosis noted.  Patient does have some tightness more over the left sacroiliac joint that seems to be somewhat worse.  Osteopathic findings   T8 extended rotated and side bent left L3 flexed rotated and side bent right Sacrum right on right    Assessment and Plan:  Low back pain Low back exam does still have some tightness noted.  Seems to be more secondary to some of the compensation for some of the hip.  We did discuss potentially working out some of the adductor's.  Discussed home exercises and icing regimen.  Follow-up with me again in 6 to 8 weeks.    Nonallopathic problems  Decision today to treat with OMT was based on Physical Exam  After verbal consent patient was treated with HVLA, ME, FPR techniques in  thoracic, lumbar, and sacral  areas  Patient tolerated the procedure well with improvement in symptoms  Patient given exercises, stretches and lifestyle modifications  See medications in patient instructions if given  Patient will follow up in 4-8 weeks             Note: This dictation was prepared with Dragon dictation along with smaller phrase technology. Any transcriptional errors that result from this process are unintentional.

## 2022-12-22 ENCOUNTER — Ambulatory Visit: Payer: 59 | Admitting: Family Medicine

## 2022-12-22 ENCOUNTER — Encounter: Payer: Self-pay | Admitting: Family Medicine

## 2022-12-22 VITALS — BP 110/82 | HR 72 | Ht 60.0 in | Wt 135.0 lb

## 2022-12-22 DIAGNOSIS — M9904 Segmental and somatic dysfunction of sacral region: Secondary | ICD-10-CM | POA: Diagnosis not present

## 2022-12-22 DIAGNOSIS — M9903 Segmental and somatic dysfunction of lumbar region: Secondary | ICD-10-CM

## 2022-12-22 DIAGNOSIS — M545 Low back pain, unspecified: Secondary | ICD-10-CM

## 2022-12-22 DIAGNOSIS — M9902 Segmental and somatic dysfunction of thoracic region: Secondary | ICD-10-CM | POA: Diagnosis not present

## 2022-12-22 DIAGNOSIS — G8929 Other chronic pain: Secondary | ICD-10-CM

## 2022-12-22 NOTE — Assessment & Plan Note (Signed)
Low back exam does still have some tightness noted.  Seems to be more secondary to some of the compensation for some of the hip.  We did discuss potentially working out some of the adductor's.  Discussed home exercises and icing regimen.  Follow-up with me again in 6 to 8 weeks.

## 2023-01-25 NOTE — Progress Notes (Signed)
  [name] [address] [place] [address] Phone: [phone] Subjective:   Bruce Donath, am serving as a scribe for Dr. Antoine Primas.  I'm seeing this patient by the request  of:  Everrett Coombe, DO  CC: Back and neck pain follow-up  [id number]  Andrea [name] is a 52 y.o. female coming in with complaint of back and neck pain. OMT 12/22/2022. Patient states that her L side is tight. No issues since lat visit.   Medications patient has been prescribed: None      Past Medical History:  Diagnosis Date   Abnormal Pap smear 2008   LGSIL   Arthritis of left hip    saw Dr. Charlett Blake, had MRI/had sx to fix-   Headache    Osteopenia    Seasonal allergies     Allergies  Allergen Reactions   Vicks Nyquil Cold & Flu Night [Dm-Apap-Cpm] Other (See Comments)    Heavy, tight chest      Objective  Blood pressure 100/64, pulse 100, height 5' (1.524 m), weight 134 lb (60.8 kg), SpO2 96 %.   General: No apparent distress alert and oriented x3 mood and affect normal, dressed appropriately.  HEENT: Pupils equal, extraocular movements intact  Respiratory: Patient's speak in full sentences and does not appear short of breath  Cardiovascular: No lower extremity edema, non tender, no erythema  Tender to palpation in the paraspinal musculature of the left side of the lower back.  Patient does have tightness and little bit with FABER test.  Negative straight leg test noted.  Osteopathic findings C6 flexed rotated and side bent left T7 extended rotated and side bent left L1 flexed rotated and side bent right Sacrum left on left       Assessment and Plan:  Low back pain Chronic problem but nothing severe at the time.  Has had difficulty with the sacroiliac joint previously.  Discussed which activities to do and which ones to avoid.  Increase activity slowly.  Follow-up with me again in 6 to 8 weeks    Nonallopathic problems  Decision  today to treat with OMT was based on Physical Exam  After verbal consent patient was treated with HVLA, ME, FPR techniques in cervical, thoracic, lumbar, and sacral  areas  Patient tolerated the procedure well with improvement in symptoms  Patient given exercises, stretches and lifestyle modifications  See medications in patient instructions if given  Patient will follow up in 6-8 weeks     The above documentation has been reviewed and is accurate and complete Judi Saa, DO         Note: This dictation was prepared with Dragon dictation along with smaller phrase technology. Any transcriptional errors that result from this process are unintentional.

## 2023-01-31 ENCOUNTER — Encounter: Payer: Self-pay | Admitting: Family Medicine

## 2023-01-31 ENCOUNTER — Ambulatory Visit: Payer: 59 | Admitting: Family Medicine

## 2023-01-31 VITALS — BP 100/64 | HR 100 | Ht 60.0 in | Wt 134.0 lb

## 2023-01-31 DIAGNOSIS — M545 Low back pain, unspecified: Secondary | ICD-10-CM

## 2023-01-31 DIAGNOSIS — M9903 Segmental and somatic dysfunction of lumbar region: Secondary | ICD-10-CM

## 2023-01-31 DIAGNOSIS — M9902 Segmental and somatic dysfunction of thoracic region: Secondary | ICD-10-CM | POA: Diagnosis not present

## 2023-01-31 DIAGNOSIS — M9904 Segmental and somatic dysfunction of sacral region: Secondary | ICD-10-CM | POA: Diagnosis not present

## 2023-01-31 DIAGNOSIS — G8929 Other chronic pain: Secondary | ICD-10-CM

## 2023-01-31 DIAGNOSIS — M9901 Segmental and somatic dysfunction of cervical region: Secondary | ICD-10-CM

## 2023-01-31 NOTE — Patient Instructions (Addendum)
Good to see you Keep enjoying the ponies See me in 6 weeks

## 2023-01-31 NOTE — Assessment & Plan Note (Signed)
Chronic problem but nothing severe at the time.  Has had difficulty with the sacroiliac joint previously.  Discussed which activities to do and which ones to avoid.  Increase activity slowly.  Follow-up with me again in 6 to 8 weeks

## 2023-02-03 ENCOUNTER — Ambulatory Visit: Payer: 59 | Admitting: Family Medicine

## 2023-03-02 ENCOUNTER — Encounter (HOSPITAL_BASED_OUTPATIENT_CLINIC_OR_DEPARTMENT_OTHER): Payer: Self-pay | Admitting: *Deleted

## 2023-03-08 NOTE — Progress Notes (Signed)
Andrea [name] [address] [place] [address] Phone: [phone] Subjective:   INadine Counts, am serving as a scribe for Dr. Antoine Primas.  I'm seeing this patient by the request  of:  Everrett Coombe, DO  CC: Back and neck pain follow-up  [id number]  Andrea [name] is a 52 y.o. female coming in with complaint of back and neck pain. OMT on 01/31/2023. Patient states same per usual. No new concerns.  Medications patient has been prescribed:   Taking:         Reviewed prior external information including notes and imaging from previsou exam, outside providers and external EMR if available.   As well as notes that were available from care everywhere and other healthcare systems.  Past medical history, social, surgical and family history all reviewed in electronic medical record.  No pertanent information unless stated regarding to the chief complaint.   Past Medical History:  Diagnosis Date   Abnormal Pap smear 2008   LGSIL   Arthritis of left hip    saw Dr. Charlett Blake, had MRI/had sx to fix-   Headache    Osteopenia    Seasonal allergies     Allergies  Allergen Reactions   Vicks Nyquil Cold & Flu Night [Dm-Apap-Cpm] Other (See Comments)    Heavy, tight chest     Review of Systems:  No headache, visual changes, nausea, vomiting, diarrhea, constipation, dizziness, abdominal pain, skin rash, fevers, chills, night sweats, weight loss, swollen lymph nodes, body aches, joint swelling, chest pain, shortness of breath, mood changes. POSITIVE muscle aches  Objective  Blood pressure 112/76, pulse 62, height 5' (1.524 m), weight 135 lb (61.2 kg), SpO2 98%.   General: No apparent distress alert and oriented x3 mood and affect normal, dressed appropriately.  HEENT: Pupils equal, extraocular movements intact  Respiratory: Patient's speak in full sentences and does not appear short of breath  Cardiovascular: No lower extremity edema, non  tender, no erythema  Back exam does have some very mild loss lordosis.  And some mild tightness noted more on the left at her scapula on the right side than usual.  Osteopathic findings  C2 flexed rotated and side bent right C5 flexed rotated and side bent left T3 extended rotated and side bent right inhaled rib T5 extended rotated and side bent left L2 flexed rotated and side bent right Sacrum right on right       Assessment and Plan:  Low back pain Low back exam does have some loss of lordosis noted.  Some tenderness to palpation in the paraspinal musculature.  Patient overall is doing still significantly better than where we thought she would be 2 years ago.  Patient will continue to stay extremely active.  Follow-up with me again in 6 to 8 weeks.    Nonallopathic problems  Decision today to treat with OMT was based on Physical Exam  After verbal consent patient was treated with HVLA, ME, FPR techniques in cervical, rib, thoracic, lumbar, and sacral  areas  Patient tolerated the procedure well with improvement in symptoms  Patient given exercises, stretches and lifestyle modifications  See medications in patient instructions if given  Patient will follow up in 6-8 weeks    The above documentation has been reviewed and is accurate and complete Judi Saa, DO          Note: This dictation was prepared with Dragon dictation along with smaller phrase technology. Any transcriptional errors that  result from this process are unintentional.

## 2023-03-14 ENCOUNTER — Ambulatory Visit: Payer: 59 | Admitting: Family Medicine

## 2023-03-14 ENCOUNTER — Encounter: Payer: Self-pay | Admitting: Family Medicine

## 2023-03-14 VITALS — BP 112/76 | HR 62 | Ht 60.0 in | Wt 135.0 lb

## 2023-03-14 DIAGNOSIS — M9904 Segmental and somatic dysfunction of sacral region: Secondary | ICD-10-CM

## 2023-03-14 DIAGNOSIS — M9908 Segmental and somatic dysfunction of rib cage: Secondary | ICD-10-CM | POA: Diagnosis not present

## 2023-03-14 DIAGNOSIS — M9901 Segmental and somatic dysfunction of cervical region: Secondary | ICD-10-CM

## 2023-03-14 DIAGNOSIS — G8929 Other chronic pain: Secondary | ICD-10-CM

## 2023-03-14 DIAGNOSIS — M9902 Segmental and somatic dysfunction of thoracic region: Secondary | ICD-10-CM

## 2023-03-14 DIAGNOSIS — M9903 Segmental and somatic dysfunction of lumbar region: Secondary | ICD-10-CM | POA: Diagnosis not present

## 2023-03-14 DIAGNOSIS — M545 Low back pain, unspecified: Secondary | ICD-10-CM | POA: Diagnosis not present

## 2023-03-14 NOTE — Assessment & Plan Note (Signed)
Low back exam does have some loss of lordosis noted.  Some tenderness to palpation in the paraspinal musculature.  Patient overall is doing still significantly better than where we thought she would be 2 years ago.  Patient will continue to stay extremely active.  Follow-up with me again in 6 to 8 weeks.

## 2023-03-14 NOTE — Patient Instructions (Signed)
See you again in 6-8 weeks Enjoy the barn

## 2023-04-20 ENCOUNTER — Other Ambulatory Visit (HOSPITAL_COMMUNITY)
Admission: RE | Admit: 2023-04-20 | Discharge: 2023-04-20 | Disposition: A | Discharge status: Home / Self Care | Payer: 59 | Source: Ambulatory Visit | Attending: Obstetrics & Gynecology | Admitting: Obstetrics & Gynecology

## 2023-04-20 ENCOUNTER — Encounter (HOSPITAL_BASED_OUTPATIENT_CLINIC_OR_DEPARTMENT_OTHER): Payer: Self-pay | Admitting: Obstetrics & Gynecology

## 2023-04-20 ENCOUNTER — Ambulatory Visit (HOSPITAL_BASED_OUTPATIENT_CLINIC_OR_DEPARTMENT_OTHER): Payer: 59 | Admitting: Obstetrics & Gynecology

## 2023-04-20 VITALS — BP 113/61 | HR 64 | Ht 60.0 in | Wt 136.0 lb

## 2023-04-20 DIAGNOSIS — Z9882 Breast implant status: Secondary | ICD-10-CM | POA: Diagnosis not present

## 2023-04-20 DIAGNOSIS — Z01419 Encounter for gynecological examination (general) (routine) without abnormal findings: Secondary | ICD-10-CM | POA: Diagnosis not present

## 2023-04-20 DIAGNOSIS — Z800 Family history of malignant neoplasm of digestive organs: Secondary | ICD-10-CM

## 2023-04-20 DIAGNOSIS — Z124 Encounter for screening for malignant neoplasm of cervix: Secondary | ICD-10-CM | POA: Diagnosis present

## 2023-04-20 NOTE — Progress Notes (Signed)
52 y.o. G3P3 Married White or Caucasian female here for annual exam.  Doing well.  Denies vaginal bleeding.  H/o endometrial ablation.    Had a hip replacement done with Dr. Charlann Boxer and has done well since then.    No LMP recorded. Patient has had an ablation.          Sexually active: Yes.    The current method of family planning is tubal ligation.    Exercising: Yes.    Smoker:  no  Health Maintenance: Pap:  2020 negative History of abnormal Pap:  LGSIL's in her 30's MMG:  pt reports this was done this summer.  Will call [facility] to get copy of mammogram Colonoscopy:  05/2021, Dr. Barron Alvine, follow up 3 Screening Labs: 10/2020   reports that she has never smoked. She has never used smokeless tobacco. She reports that she does not currently use alcohol. She reports that she does not use drugs.  Past Medical History:  Diagnosis Date   Abnormal Pap smear 2008   LGSIL   Arthritis of left hip    saw Dr. Charlett Blake, had MRI/had sx to fix-   Headache    Osteopenia    Seasonal allergies     Past Surgical History:  Procedure Laterality Date   BREAST ENHANCEMENT SURGERY Bilateral 1998   BREAST SURGERY Right 01/2007   revision on breast due to tight muscles   CESAREAN SECTION  1995   NASAL SEPTOPLASTY W/ TURBINOPLASTY  03/2019   TUBAL LIGATION  2008   VAGINA RECONSTRUCTION SURGERY  10/2013   Vaginal rejuvination surgery, Dr. Mikki Santee, [place]- due to muscle tear   WISDOM TOOTH EXTRACTION      Current Outpatient Medications  Medication Sig Dispense Refill   EPINEPHrine 0.3 mg/0.3 mL IJ SOAJ injection See admin instructions.     levocetirizine (XYZAL) 5 MG tablet Take 5 mg by mouth every evening.     Multiple Vitamins-Minerals (MULTIVITAMIN PO) Take 1 tablet by mouth daily.     Omega-3 Fatty Acids (FISH OIL PO) Take 1 capsule by mouth daily at 6 (six) AM.     Probiotic Product (PROBIOTIC PO) Take 1 capsule by mouth daily at 6 (six) AM.     progesterone (PROMETRIUM) 100 MG capsule  Take 2 capsules by mouth daily at 2 am.     progesterone (PROMETRIUM) 200 MG capsule Take 200 mg by mouth at bedtime.     thyroid (ARMOUR) 90 MG tablet Take 90 mg by mouth daily.     tretinoin (RETIN-A) 0.1 % cream Apply 1 application topically daily.     zinc gluconate 50 MG tablet Take 50 mg by mouth every other day.     No current facility-administered medications for this visit.    Family History  Problem Relation Age of Onset   Colon polyps Mother 38   Colon cancer Mother 67   Colon polyps Father 82   Colon polyps Maternal Grandmother 2   Colon cancer Maternal Grandmother 20   Congestive Heart Failure Maternal Grandfather    Colon polyps Maternal Grandfather    Diabetes Paternal Grandmother    Hypertension Paternal Grandmother    Stroke Paternal Grandfather    Esophageal cancer Neg Hx    Stomach cancer Neg Hx    Rectal cancer Neg Hx     ROS: Constitutional: negative Genitourinary:negative  Exam:   BP 113/61 (BP Location: Right Arm, Patient Position: Sitting, Cuff Size: Normal)   Pulse 64   Ht 5' (1.524 m)  Wt 136 lb (61.7 kg)   BMI 26.56 kg/m   Height: 5' (152.4 cm)  General appearance: alert, cooperative and appears stated age Head: Normocephalic, without obvious abnormality, atraumatic Neck: no adenopathy, supple, symmetrical, trachea midline and thyroid normal to inspection and palpation Lungs: normal breathing pattern Breasts: normal appearance, no masses or tenderness Heart: regular rate and rhythm Abdomen: soft, non-tender; bowel sounds normal; no masses,  no organomegaly Extremities: extremities normal, atraumatic, no cyanosis or edema Skin: Skin color, texture, turgor normal. No rashes or lesions Lymph nodes: Cervical, supraclavicular, and axillary nodes normal. No abnormal inguinal nodes palpated Neurologic: Grossly normal   Pelvic: External genitalia:  no lesions              Urethra:  normal appearing urethra with no masses, tenderness or  lesions              Bartholins and Skenes: normal                 Vagina: normal appearing vagina with normal color and no discharge, no lesions              Cervix: no lesions              Pap taken: Yes.   Bimanual Exam:  Uterus:  normal size, contour, position, consistency, mobility, non-tender              Adnexa: normal adnexa and no mass, fullness, tenderness               Rectovaginal: Confirms               Anus:  normal sphincter tone, no lesions  Chaperone, Hendricks Milo, CMA, was present for exam.  Assessment/Plan: 1. Well woman exam with routine gynecological exam - Pap smear and HR HPV obtained today - Mammogram done this year per pt.  Will call for copy of MMG. - Colonoscopy 2022.  Follow up 3 years. - Bone mineral density not indicated at this time - lab work is done at [facility] - vaccines reviewed/updated  2. Cervical cancer screening - Cytology - PAP( [place])  3. History of breast implant  4. Family history of colon cancer

## 2023-04-24 LAB — CYTOLOGY - PAP
Adequacy: ABSENT
Comment: NEGATIVE
Diagnosis: NEGATIVE
High risk HPV: NEGATIVE

## 2023-04-24 NOTE — Progress Notes (Unsigned)
[name] [address] [place] [address] Phone: [phone] Subjective:   INadine [name], am serving as a scribe for Dr. Antoine Primas.  I'm seeing this patient by the request  of:  Everrett Coombe, DO  CC: Neck and back pain follow-up  [id number]  Andrea [name] is a 52 y.o. female coming in with complaint of back and neck pain. OMT 03/14/2023. Patient states doing well. Same per usual. R knee feeling a little weird.   Medications patient has been prescribed: None  Taking:         Reviewed prior external information including notes and imaging from previsou exam, outside providers and external EMR if available.   As well as notes that were available from care everywhere and other healthcare systems.  Past medical history, social, surgical and family history all reviewed in electronic medical record.  No pertanent information unless stated regarding to the chief complaint.   Past Medical History:  Diagnosis Date   Abnormal Pap smear 2008   LGSIL   Arthritis of left hip    saw Dr. Charlett Blake, had MRI/had sx to fix-   Headache    Osteopenia    Seasonal allergies     Allergies  Allergen Reactions   Vicks Nyquil Cold & Flu Night [Dm-Apap-Cpm] Other (See Comments)    Heavy, tight chest     Review of Systems:  No headache, visual changes, nausea, vomiting, diarrhea, constipation, dizziness, abdominal pain, skin rash, fevers, chills, night sweats, weight loss, swollen lymph nodes, body aches, joint swelling, chest pain, shortness of breath, mood changes. POSITIVE muscle aches  Objective  Blood pressure 118/72, pulse 96, height 5' (1.524 m), weight 135 lb (61.2 kg), SpO2 98%.   General: No apparent distress alert and oriented x3 mood and affect normal, dressed appropriately.  HEENT: Pupils equal, extraocular movements intact  Respiratory: Patient's speak in full sentences and does not appear short of breath  Cardiovascular: No  lower extremity edema, non tender, no erythema  Neck exam does have some loss lordosis noted.  Some tenderness to palpation in the paraspinal musculature more on the lower back.  Seems to be more on the right side of both the neck and the back.  Seems to be more over the sacroiliac joint on the right side.  Osteopathic findings  C2 flexed rotated and side bent right C6 flexed rotated and side bent right T3 extended rotated and side bent right inhaled rib T9 extended rotated and side bent right L2 flexed rotated and side bent right Sacrum right on right       Assessment and Plan:  Low back pain I believe that this is more secondary to an exacerbation of the back with patient compensating for the knee pain.  We discussed with patient his knee pain could be more of a irritation secondary to compression of the patella.  Giving patient more of a patellofemoral syndrome.  No signs of though any instability noted at the moment.  Discussed icing regimen and home exercises.  Increase activity slowly.  Follow-up again in 6 to 8 weeks    Nonallopathic problems  Decision today to treat with OMT was based on Physical Exam  After verbal consent patient was treated with HVLA, ME, FPR techniques in cervical, rib, thoracic, lumbar, and sacral  areas  Patient tolerated the procedure well with improvement in symptoms  Patient given exercises, stretches and lifestyle modifications  See medications in patient instructions if given  Patient will follow up in 4-8 weeks    The above documentation has been reviewed and is accurate and complete Judi Saa, DO          Note: This dictation was prepared with Dragon dictation along with smaller phrase technology. Any transcriptional errors that result from this process are unintentional.

## 2023-04-25 ENCOUNTER — Ambulatory Visit: Payer: 59 | Admitting: Family Medicine

## 2023-04-25 ENCOUNTER — Encounter: Payer: Self-pay | Admitting: Family Medicine

## 2023-04-25 VITALS — BP 118/72 | HR 96 | Ht 60.0 in | Wt 135.0 lb

## 2023-04-25 DIAGNOSIS — M9902 Segmental and somatic dysfunction of thoracic region: Secondary | ICD-10-CM

## 2023-04-25 DIAGNOSIS — M9903 Segmental and somatic dysfunction of lumbar region: Secondary | ICD-10-CM | POA: Diagnosis not present

## 2023-04-25 DIAGNOSIS — M545 Low back pain, unspecified: Secondary | ICD-10-CM

## 2023-04-25 DIAGNOSIS — M9901 Segmental and somatic dysfunction of cervical region: Secondary | ICD-10-CM | POA: Diagnosis not present

## 2023-04-25 DIAGNOSIS — M9908 Segmental and somatic dysfunction of rib cage: Secondary | ICD-10-CM | POA: Diagnosis not present

## 2023-04-25 DIAGNOSIS — M9904 Segmental and somatic dysfunction of sacral region: Secondary | ICD-10-CM | POA: Diagnosis not present

## 2023-04-25 DIAGNOSIS — G8929 Other chronic pain: Secondary | ICD-10-CM | POA: Diagnosis not present

## 2023-04-25 NOTE — Assessment & Plan Note (Signed)
I believe that this is more secondary to an exacerbation of the back with patient compensating for the knee pain.  We discussed with patient his knee pain could be more of a irritation secondary to compression of the patella.  Giving patient more of a patellofemoral syndrome.  No signs of though any instability noted at the moment.  Discussed icing regimen and home exercises.  Increase activity slowly.  Follow-up again in 6 to 8 weeks

## 2023-04-25 NOTE — Patient Instructions (Signed)
Good to see you! See you again in 5-6 weeks

## 2023-05-29 NOTE — Progress Notes (Unsigned)
[name] [address] [place] [address] Phone: [phone] Subjective:   Bruce Donath, am serving as a scribe for Dr. Antoine Primas.  I'm seeing this patient by the request  of:  Everrett Coombe, DO  CC: Back and neck pain follow-up  [id number]  Andrea [name] is a 52 y.o. female coming in with complaint of back and neck pain. OMT 04/25/2023. Patient states that she does still have R knee pain with certain exercises. Pain is not worse.   Medications patient has been prescribed:None   Taking:         Reviewed prior external information including notes and imaging from previsou exam, outside providers and external EMR if available.   As well as notes that were available from care everywhere and other healthcare systems.  Past medical history, social, surgical and family history all reviewed in electronic medical record.  No pertanent information unless stated regarding to the chief complaint.   Past Medical History:  Diagnosis Date   Abnormal Pap smear 2008   LGSIL   Arthritis of left hip    saw Dr. Charlett Blake, had MRI/had sx to fix-   Headache    Osteopenia    Seasonal allergies     Allergies  Allergen Reactions   Vicks Nyquil Cold & Flu Night [Dm-Apap-Cpm] Other (See Comments)    Heavy, tight chest     Review of Systems:  No headache, visual changes, nausea, vomiting, diarrhea, constipation, dizziness, abdominal pain, skin rash, fevers, chills, night sweats, weight loss, swollen lymph nodes, body aches, joint swelling, chest pain, shortness of breath, mood changes. POSITIVE muscle aches  Objective  Blood pressure 128/78, pulse 89, height 5' (1.524 m), weight 135 lb (61.2 kg), SpO2 98%.   General: No apparent distress alert and oriented x3 mood and affect normal, dressed appropriately.  HEENT: Pupils equal, extraocular movements intact  Respiratory: Patient's speak in full sentences and does not appear short of breath   Cardiovascular: No lower extremity edema, non tender, no erythema  Gait normal MSK:  Back does have some loss of lordosis noted.  Some tenderness to palpation of the paraspinal musculature.  Some tightness noted with FABER test right greater than left.  Osteopathic findings  C2 flexed rotated and side bent right C6 flexed rotated and side bent left T3 extended rotated and side bent right inhaled rib T9 extended rotated and side bent left L2 flexed rotated and side bent right Sacrum right on right       Assessment and Plan:  Low back pain Low back exam does have some loss lordosis noted.  Some tenderness to palpation in the paraspinal muscles.  Continue to stay active otherwise.  No change in management.  Does complain of some knee pain but still denies wanting to get any further imaging.  Follow-up again in 6 to 8 weeks    Nonallopathic problems  Decision today to treat with OMT was based on Physical Exam  After verbal consent patient was treated with HVLA, ME, FPR techniques in cervical, rib, thoracic, lumbar, and sacral  areas  Patient tolerated the procedure well with improvement in symptoms  Patient given exercises, stretches and lifestyle modifications  See medications in patient instructions if given  Patient will follow up in 4-8 weeks     The above documentation has been reviewed and is accurate and complete Judi Saa, DO         Note: This dictation was prepared with  Dragon dictation along with smaller Lobbyist. Any transcriptional errors that result from this process are unintentional.

## 2023-05-30 ENCOUNTER — Encounter: Payer: Self-pay | Admitting: Family Medicine

## 2023-05-30 ENCOUNTER — Ambulatory Visit: Payer: 59 | Admitting: Family Medicine

## 2023-05-30 VITALS — BP 128/78 | HR 89 | Ht 60.0 in | Wt 135.0 lb

## 2023-05-30 DIAGNOSIS — M9903 Segmental and somatic dysfunction of lumbar region: Secondary | ICD-10-CM | POA: Diagnosis not present

## 2023-05-30 DIAGNOSIS — M9904 Segmental and somatic dysfunction of sacral region: Secondary | ICD-10-CM | POA: Diagnosis not present

## 2023-05-30 DIAGNOSIS — M9901 Segmental and somatic dysfunction of cervical region: Secondary | ICD-10-CM | POA: Diagnosis not present

## 2023-05-30 DIAGNOSIS — M9902 Segmental and somatic dysfunction of thoracic region: Secondary | ICD-10-CM

## 2023-05-30 DIAGNOSIS — G8929 Other chronic pain: Secondary | ICD-10-CM

## 2023-05-30 DIAGNOSIS — M545 Low back pain, unspecified: Secondary | ICD-10-CM

## 2023-05-30 DIAGNOSIS — M9908 Segmental and somatic dysfunction of rib cage: Secondary | ICD-10-CM

## 2023-05-30 NOTE — Assessment & Plan Note (Signed)
Low back exam does have some loss lordosis noted.  Some tenderness to palpation in the paraspinal muscles.  Continue to stay active otherwise.  No change in management.  Does complain of some knee pain but still denies wanting to get any further imaging.  Follow-up again in 6 to 8 weeks

## 2023-05-30 NOTE — Patient Instructions (Signed)
Great to see you See me again in 6 weeks

## 2023-07-14 NOTE — Progress Notes (Unsigned)
[name] [address] [place] [address] Phone: [phone] Subjective:   Andrea [name], am serving as a scribe for Dr. Antoine [name].  I'm seeing this patient by the request  of:  Andrea Coombe, DO  CC: Back and neck pain follow-up  [id number]  Andrea [name] is a 52 y.o. female coming in with complaint of back and neck pain. OMT 05/30/2023. Patient states same per usual. No new concerns.  Medications patient has been prescribed: None  Taking:         Reviewed prior external information including notes and imaging from previsou exam, outside providers and external EMR if available.   As well as notes that were available from care everywhere and other healthcare systems.  Past medical history, social, surgical and family history all reviewed in electronic medical record.  No pertanent information unless stated regarding to the chief complaint.   Past Medical History:  Diagnosis Date   Abnormal Pap smear 2008   LGSIL   Arthritis of left hip    saw Dr. Charlett [name], had MRI/had sx to fix-   Headache    Osteopenia    Seasonal allergies     Allergies  Allergen Reactions   Vicks Nyquil Cold & Flu Night [Dm-Apap-Cpm] Other (See Comments)    Heavy, tight chest     Review of Systems:  No headache, visual changes, nausea, vomiting, diarrhea, constipation, dizziness, abdominal pain, skin rash, fevers, chills, night sweats, weight loss, swollen lymph nodes, body aches, joint swelling, chest pain, shortness of breath, mood changes. POSITIVE muscle aches  Objective  Blood pressure 108/74, pulse 73, height 5' (1.524 m), weight 137 lb (62.1 kg), SpO2 97%.   General: No apparent distress alert and oriented x3 mood and affect normal, dressed appropriately.  HEENT: Pupils equal, extraocular movements intact  Respiratory: Patient's speak in full sentences and does not appear short of breath  Cardiovascular: No lower extremity edema, non  tender, no erythema  Gait MSK:  Back does have some loss of lordosis noted.  Very mild overall.  Patient has very good hip abductor strength which is an improvement.  Still some limited range of motion with certain movements.  Follow-up again in 6 to 8 weeks.  Osteopathic findings  C2 flexed rotated and side bent right C6 flexed rotated and side bent left T3 extended rotated and side bent right inhaled rib T9 extended rotated and side bent left L3 flexed rotated and side bent left Sacrum right on right       Assessment and Plan:  Low back pain Low back pain that is multifactorial.  Continue to be extremely active.  Discussed posture and ergonomics otherwise.  Continue to work on the core strengthening which patient is doing tremendously well with.  Responds well to osteopathic manipulation when necessary.  Follow-up again in 6 to 8 weeks.    Nonallopathic problems  Decision today to treat with OMT was based on Physical Exam  After verbal consent patient was treated with HVLA, ME, FPR techniques in cervical, rib, thoracic, lumbar, and sacral  areas  Patient tolerated the procedure well with improvement in symptoms  Patient given exercises, stretches and lifestyle modifications  See medications in patient instructions if given  Patient will follow up in 4-8 weeks     The above documentation has been reviewed and is accurate and complete Andrea Saa, DO         Note: This dictation was prepared with Reubin Milan  dictation along with smaller phrase technology. Any transcriptional errors that result from this process are unintentional.

## 2023-07-19 ENCOUNTER — Ambulatory Visit: Payer: 59 | Admitting: Family Medicine

## 2023-07-19 ENCOUNTER — Encounter: Payer: Self-pay | Admitting: Family Medicine

## 2023-07-19 VITALS — BP 108/74 | HR 73 | Ht 60.0 in | Wt 137.0 lb

## 2023-07-19 DIAGNOSIS — M9901 Segmental and somatic dysfunction of cervical region: Secondary | ICD-10-CM | POA: Diagnosis not present

## 2023-07-19 DIAGNOSIS — M545 Low back pain, unspecified: Secondary | ICD-10-CM

## 2023-07-19 DIAGNOSIS — M9903 Segmental and somatic dysfunction of lumbar region: Secondary | ICD-10-CM

## 2023-07-19 DIAGNOSIS — M9902 Segmental and somatic dysfunction of thoracic region: Secondary | ICD-10-CM

## 2023-07-19 DIAGNOSIS — M9908 Segmental and somatic dysfunction of rib cage: Secondary | ICD-10-CM | POA: Diagnosis not present

## 2023-07-19 DIAGNOSIS — M9904 Segmental and somatic dysfunction of sacral region: Secondary | ICD-10-CM

## 2023-07-19 DIAGNOSIS — G8929 Other chronic pain: Secondary | ICD-10-CM

## 2023-07-19 NOTE — Patient Instructions (Addendum)
Good to see you!!    Happy Holidays!

## 2023-07-19 NOTE — Assessment & Plan Note (Signed)
Low back pain that is multifactorial.  Continue to be extremely active.  Discussed posture and ergonomics otherwise.  Continue to work on the core strengthening which patient is doing tremendously well with.  Responds well to osteopathic manipulation when necessary.  Follow-up again in 6 to 8 weeks.

## 2023-09-01 NOTE — Progress Notes (Unsigned)
[name] [address] [place] [address] Phone: [phone] Subjective:   Bruce Donath, am serving as a scribe for Dr. Antoine Primas.  I'm seeing this patient by the request  of:  Everrett Coombe, DO  CC: back pain and hip pain follow up   [id number]  Andrea [name] is a 53 y.o. female coming in with complaint of back and neck pain. OMT 07/19/2023. Patient states that she is tight from sitting around more.  Patient is try to find motivation to do different activities or exercises.  Does not want to do heavy lifting anymore.  Wants to know what stretches are safe with her hip replacement.   Medications patient has been prescribed: None  Taking:         Reviewed prior external information including notes and imaging from previsou exam, outside providers and external EMR if available.   As well as notes that were available from care everywhere and other healthcare systems.  Past medical history, social, surgical and family history all reviewed in electronic medical record.  No pertanent information unless stated regarding to the chief complaint.   Past Medical History:  Diagnosis Date   Abnormal Pap smear 2008   LGSIL   Arthritis of left hip    saw Dr. Charlett Blake, had MRI/had sx to fix-   Headache    Osteopenia    Seasonal allergies     Allergies  Allergen Reactions   Vicks Nyquil Cold & Flu Night [Dm-Apap-Cpm] Other (See Comments)    Heavy, tight chest     Review of Systems:  No headache, visual changes, nausea, vomiting, diarrhea, constipation, dizziness, abdominal pain, skin rash, fevers, chills, night sweats, weight loss, swollen lymph nodes, body aches, joint swelling, chest pain, shortness of breath, mood changes. POSITIVE muscle aches  Objective  Blood pressure 100/64, pulse 77, height 5' (1.524 m), weight 134 lb (60.8 kg), SpO2 98%.   General: No apparent distress alert and oriented x3 mood and affect normal,  dressed appropriately.  HEENT: Pupils equal, extraocular movements intact  Respiratory: Patient's speak in full sentences and does not appear short of breath  Cardiovascular: No lower extremity edema, non tender, no erythema  Gait MSK:  Back does have some loss lordosis noted.  Some tenderness to palpation in the paraspinal musculature.  Osteopathic findings  C2 flexed rotated and side bent right C6 flexed rotated and side bent left T5 extended rotated and side bent right inhaled rib T6 extended rotated and side bent left L2 flexed rotated and side bent right L3 flexed rotated and side bent left Sacrum right on right       Assessment and Plan:  Low back pain Patient has had some mild neck pain overall.  We discussed different lifting mechanics.  Patient will try to do more primal movements that I think will be more beneficial for her.  See how patient responds to this.  Discussed icing regimen and home exercises, which activities to do and which ones to avoid.  Follow-up again in 6 to 8 weeks    Nonallopathic problems  Decision today to treat with OMT was based on Physical Exam  After verbal consent patient was treated with HVLA, ME, FPR techniques in cervical, rib, thoracic, lumbar, and sacral  areas  Patient tolerated the procedure well with improvement in symptoms  Patient given exercises, stretches and lifestyle modifications  See medications in patient instructions if given  Patient will follow up in 4-8  weeks     The above documentation has been reviewed and is accurate and complete Judi Saa, DO         Note: This dictation was prepared with Dragon dictation along with smaller phrase technology. Any transcriptional errors that result from this process are unintentional.

## 2023-09-04 ENCOUNTER — Encounter: Payer: Self-pay | Admitting: Family Medicine

## 2023-09-04 ENCOUNTER — Ambulatory Visit: Payer: 59 | Admitting: Family Medicine

## 2023-09-04 VITALS — BP 100/64 | HR 77 | Ht 60.0 in | Wt 134.0 lb

## 2023-09-04 DIAGNOSIS — M545 Low back pain, unspecified: Secondary | ICD-10-CM | POA: Diagnosis not present

## 2023-09-04 DIAGNOSIS — M9903 Segmental and somatic dysfunction of lumbar region: Secondary | ICD-10-CM | POA: Diagnosis not present

## 2023-09-04 DIAGNOSIS — M9908 Segmental and somatic dysfunction of rib cage: Secondary | ICD-10-CM | POA: Diagnosis not present

## 2023-09-04 DIAGNOSIS — M9904 Segmental and somatic dysfunction of sacral region: Secondary | ICD-10-CM | POA: Diagnosis not present

## 2023-09-04 DIAGNOSIS — G8929 Other chronic pain: Secondary | ICD-10-CM

## 2023-09-04 DIAGNOSIS — M9901 Segmental and somatic dysfunction of cervical region: Secondary | ICD-10-CM | POA: Diagnosis not present

## 2023-09-04 DIAGNOSIS — M9902 Segmental and somatic dysfunction of thoracic region: Secondary | ICD-10-CM | POA: Diagnosis not present

## 2023-09-04 NOTE — Assessment & Plan Note (Signed)
Patient has had some mild neck pain overall.  We discussed different lifting mechanics.  Patient will try to do more primal movements that I think will be more beneficial for her.  See how patient responds to this.  Discussed icing regimen and home exercises, which activities to do and which ones to avoid.  Follow-up again in 6 to 8 weeks

## 2023-09-04 NOTE — Patient Instructions (Signed)
Stay warm See me in 6-8 weeks

## 2023-10-18 NOTE — Progress Notes (Unsigned)
 [name] [address] [place] [address] Phone: [phone] Subjective:   INadine Counts, am serving as a scribe for Dr. Antoine Primas.  I'm seeing this patient by the request  of:  Everrett Coombe, DO  CC: Back and neck pain follow-up  [id number]  Andrea [name] is a 53 y.o. female coming in with complaint of back and neck pain. OMT 09/04/2023. Patient states doing well. No new changes, no new symptoms.  Medications patient has been prescribed: None  Taking:         Reviewed prior external information including notes and imaging from previsou exam, outside providers and external EMR if available.   As well as notes that were available from care everywhere and other healthcare systems.  Past medical history, social, surgical and family history all reviewed in electronic medical record.  No pertanent information unless stated regarding to the chief complaint.   Past Medical History:  Diagnosis Date   Abnormal Pap smear 2008   LGSIL   Arthritis of left hip    saw Dr. Charlett Blake, had MRI/had sx to fix-   Headache    Osteopenia    Seasonal allergies     Allergies  Allergen Reactions   Vicks Nyquil Cold & Flu Night [Dm-Apap-Cpm] Other (See Comments)    Heavy, tight chest     Review of Systems:  No headache, visual changes, nausea, vomiting, diarrhea, constipation, dizziness, abdominal pain, skin rash, fevers, chills, night sweats, weight loss, swollen lymph nodes, body aches, joint swelling, chest pain, shortness of breath, mood changes. POSITIVE muscle aches  Objective  Blood pressure 104/70, pulse 76, height 5' (1.524 m), weight 135 lb (61.2 kg), SpO2 96%.   General: No apparent distress alert and oriented x3 mood and affect normal, dressed appropriately.  HEENT: Pupils equal, extraocular movements intact  Respiratory: Patient's speak in full sentences and does not appear short of breath  Cardiovascular: No lower extremity  edema, non tender, no erythema  Gait relatively normal MSK:  Back does have some loss lordosis noted and tenderness to palpation noted.  Positive FABER test right greater than left.  Neck does have some tightness noted and seems to be on the right greater than left.  This is somewhat different and sometimes what we see.  Osteopathic findings  C2 flexed rotated and side bent left C5 flexed rotated and side bent right T3 extended rotated and side bent right inhaled rib T9 extended rotated and side bent left L2 flexed rotated and side bent right L3 flexed rotated and side bent left Sacrum right on right       Assessment and Plan:  Low back pain Low back overall is doing relatively well.  Does respond extremely well though to osteopathic manipulation.  Continuing to stay active.  Nothing that is stopping her from activity.  Responded well to OMT and follow-up again in 6 to 8 weeks    Nonallopathic problems  Decision today to treat with OMT was based on Physical Exam  After verbal consent patient was treated with HVLA, ME, FPR techniques in cervical, rib, thoracic, lumbar, and sacral  areas  Patient tolerated the procedure well with improvement in symptoms  Patient given exercises, stretches and lifestyle modifications  See medications in patient instructions if given  Patient will follow up in 4-8 weeks    The above documentation has been reviewed and is accurate and complete Judi Saa, DO  Note: This dictation was prepared with Dragon dictation along with smaller phrase technology. Any transcriptional errors that result from this process are unintentional.

## 2023-10-19 ENCOUNTER — Encounter: Payer: Self-pay | Admitting: Family Medicine

## 2023-10-19 ENCOUNTER — Ambulatory Visit: Payer: 59 | Admitting: Family Medicine

## 2023-10-19 VITALS — BP 104/70 | HR 76 | Ht 60.0 in | Wt 135.0 lb

## 2023-10-19 DIAGNOSIS — M545 Low back pain, unspecified: Secondary | ICD-10-CM | POA: Diagnosis not present

## 2023-10-19 DIAGNOSIS — M9908 Segmental and somatic dysfunction of rib cage: Secondary | ICD-10-CM

## 2023-10-19 DIAGNOSIS — M9901 Segmental and somatic dysfunction of cervical region: Secondary | ICD-10-CM | POA: Diagnosis not present

## 2023-10-19 DIAGNOSIS — G8929 Other chronic pain: Secondary | ICD-10-CM

## 2023-10-19 DIAGNOSIS — M9903 Segmental and somatic dysfunction of lumbar region: Secondary | ICD-10-CM

## 2023-10-19 DIAGNOSIS — M9902 Segmental and somatic dysfunction of thoracic region: Secondary | ICD-10-CM | POA: Diagnosis not present

## 2023-10-19 DIAGNOSIS — M9904 Segmental and somatic dysfunction of sacral region: Secondary | ICD-10-CM

## 2023-10-19 NOTE — Patient Instructions (Addendum)
Good to see you! See you again in 6-8 weeks 

## 2023-10-19 NOTE — Assessment & Plan Note (Signed)
 Low back overall is doing relatively well.  Does respond extremely well though to osteopathic manipulation.  Continuing to stay active.  Nothing that is stopping her from activity.  Responded well to OMT and follow-up again in 6 to 8 weeks

## 2024-01-02 NOTE — Progress Notes (Unsigned)
 [name] [address] [place] [address] Phone: [phone] Subjective:   Andrea [name], am serving as a scribe for Dr. Ronnell Coins.  I'm seeing this patient by the request  of:  Adela Holter, DO  CC: Back and neck pain follow-up  [id number]  Andrea [name] is a 53 y.o. female coming in with complaint of back and neck pain. OMT 10/19/2023. Patient states that she is having L sided neck pain after sleeping wrong. Pain into the L scapula as well.   Medications patient has been prescribed: None  Taking:         Reviewed prior external information including notes and imaging from previsou exam, outside providers and external EMR if available.   As well as notes that were available from care everywhere and other healthcare systems.  Past medical history, social, surgical and family history all reviewed in electronic medical record.  No pertanent information unless stated regarding to the chief complaint.   Past Medical History:  Diagnosis Date   Abnormal Pap smear 2008   LGSIL   Arthritis of left hip    saw Dr. [place], had MRI/had sx to fix-   Headache    Osteopenia    Seasonal allergies     Allergies  Allergen Reactions   Vicks Nyquil Cold & Flu Night [Dm-Apap-Cpm] Other (See Comments)    Heavy, tight chest     Review of Systems:  No headache, visual changes, nausea, vomiting, diarrhea, constipation, dizziness, abdominal pain, skin rash, fevers, chills, night sweats, weight loss, swollen lymph nodes, body aches, joint swelling, chest pain, shortness of breath, mood changes. POSITIVE muscle aches  Objective  Blood pressure 110/72, pulse 79, height 5' (1.524 m), weight 136 lb (61.7 kg), SpO2 97%.   General: No apparent distress alert and oriented x3 mood and affect normal, dressed appropriately.  HEENT: Pupils equal, extraocular movements intact  Respiratory: Patient's speak in full sentences and does not appear  short of breath  Cardiovascular: No lower extremity edema, non tender, no erythema  Gait MSK:  Back significant tightness noted on the left side of the parascapular area.  Neck exam does have some limited sidebending as well as rotation to the left noted today.  Negative Spurling's. Multiple trigger points noted in the trapezius, rhomboid and latissimus dorsi on the left side on the medial border of the scapula.  After verbal consent patient was prepped with alcohol swab and with a 25-gauge half inch needle injected with 0.5 cc of 0.5% Marcaine and 0.5 cc of Kenalog  40 mg/mL.  Minimal blood loss.  Band-Aid placed.  Postinjection instruction given.  Osteopathic findings  C2 flexed rotated and side bent left C6 flexed rotated and side bent left T3 extended rotated and side bent right inhaled rib T6 extended rotated and side bent left L2 flexed rotated and side bent right L3 flexed rotated and side bent left Sacrum right on right       Assessment and Plan:  Trigger point of left shoulder region Patient given injection and tolerated the procedure well, discussed icing regimen and home exercises, discussed which activities to do and which ones to avoid.  I do think stress is playing a negative overall.  Increase activity slowly.  Worsening pain.  See us  sooner otherwise follow-up in 6 to 8 weeks.  Low back pain Stable overall.  Discussed icing regimen and home exercises.  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  Continues to  workout on a regular basis.  I think patient can continue with the conservative therapy for everything at this time.  Follow-up again in 6 to 8 weeks otherwise.    Nonallopathic problems  Decision today to treat with OMT was based on Physical Exam  After verbal consent patient was treated with HVLA, ME, FPR techniques in cervical, rib, thoracic, lumbar, and sacral  areas  Patient tolerated the procedure well with improvement in symptoms  Patient  given exercises, stretches and lifestyle modifications  See medications in patient instructions if given  Patient will follow up in 4-8 weeks     The above documentation has been reviewed and is accurate and complete Leni Pankonin M Markus Casten, DO         Note: This dictation was prepared with Dragon dictation along with smaller phrase technology. Any transcriptional errors that result from this process are unintentional.

## 2024-01-03 ENCOUNTER — Encounter: Payer: Self-pay | Admitting: Family Medicine

## 2024-01-03 ENCOUNTER — Ambulatory Visit: Admitting: Family Medicine

## 2024-01-03 ENCOUNTER — Ambulatory Visit (INDEPENDENT_AMBULATORY_CARE_PROVIDER_SITE_OTHER)

## 2024-01-03 VITALS — BP 110/72 | HR 79 | Ht 60.0 in | Wt 136.0 lb

## 2024-01-03 DIAGNOSIS — M9901 Segmental and somatic dysfunction of cervical region: Secondary | ICD-10-CM | POA: Diagnosis not present

## 2024-01-03 DIAGNOSIS — G8929 Other chronic pain: Secondary | ICD-10-CM

## 2024-01-03 DIAGNOSIS — M542 Cervicalgia: Secondary | ICD-10-CM

## 2024-01-03 DIAGNOSIS — M9902 Segmental and somatic dysfunction of thoracic region: Secondary | ICD-10-CM

## 2024-01-03 DIAGNOSIS — M9904 Segmental and somatic dysfunction of sacral region: Secondary | ICD-10-CM | POA: Diagnosis not present

## 2024-01-03 DIAGNOSIS — M9908 Segmental and somatic dysfunction of rib cage: Secondary | ICD-10-CM | POA: Diagnosis not present

## 2024-01-03 DIAGNOSIS — M25512 Pain in left shoulder: Secondary | ICD-10-CM

## 2024-01-03 DIAGNOSIS — M9903 Segmental and somatic dysfunction of lumbar region: Secondary | ICD-10-CM | POA: Diagnosis not present

## 2024-01-03 DIAGNOSIS — M545 Low back pain, unspecified: Secondary | ICD-10-CM | POA: Diagnosis not present

## 2024-01-03 NOTE — Assessment & Plan Note (Signed)
 Patient given injection and tolerated the procedure well, discussed icing regimen and home exercises, discussed which activities to do and which ones to avoid.  I do think stress is playing a negative overall.  Increase activity slowly.  Worsening pain.  See us  sooner otherwise follow-up in 6 to 8 weeks.

## 2024-01-03 NOTE — Patient Instructions (Addendum)
 Trigger point injections today Good to see you  See me in 5-6 weeks

## 2024-01-03 NOTE — Assessment & Plan Note (Signed)
 Stable overall.  Discussed icing regimen and home exercises.  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  Continues to workout on a regular basis.  I think patient can continue with the conservative therapy for everything at this time.  Follow-up again in 6 to 8 weeks otherwise.

## 2024-01-11 ENCOUNTER — Ambulatory Visit: Payer: Self-pay | Admitting: Family Medicine

## 2024-02-06 NOTE — Progress Notes (Unsigned)
 [name] [address] [place] [address] Phone: [phone] Subjective:   [name], am serving as a scribe for Dr. Arthea Claudene.  I'm seeing this patient by the request  of:  Alvia Bring, DO  CC: back and neck pain   [name]  Andrea [name] is a 53 y.o. female coming in with complaint of back and neck pain. OMT 01/03/2024. Patient states doing well. No new symptoms.  Continues to have some tightness.  Nothing that stopping her from activity.  Did ride a disgruntled horse yesterday  Medications patient has been prescribed: None  Taking:         Reviewed prior external information including notes and imaging from previsou exam, outside providers and external EMR if available.   As well as notes that were available from care everywhere and other healthcare systems.  Past medical history, social, surgical and family history all reviewed in electronic medical record.  No pertanent information unless stated regarding to the chief complaint.   Past Medical History:  Diagnosis Date   Abnormal Pap smear 2008   LGSIL   Arthritis of left hip    saw Dr. Gaspar, had MRI/had sx to fix-   Headache    Osteopenia    Seasonal allergies     Allergies  Allergen Reactions   Vicks Nyquil Cold & Flu Night [Dm-Apap-Cpm] Other (See Comments)    Heavy, tight chest     Review of Systems:  No headache, visual changes, nausea, vomiting, diarrhea, constipation, dizziness, abdominal pain, skin rash, fevers, chills, night sweats, weight loss, swollen lymph nodes, body aches, joint swelling, chest pain, shortness of breath, mood changes. POSITIVE muscle aches  Objective  Blood pressure 110/68, pulse 69, height 5' (1.524 m), SpO2 97%.   General: No apparent distress alert and oriented x3 mood and affect normal, dressed appropriately.  HEENT: Pupils equal, extraocular movements intact  Respiratory: Patient's speak in full sentences and  does not appear short of breath  Cardiovascular: No lower extremity edema, non tender, no erythema  Significant tightness noted.  No pain in the piriformis but does have tightness with straight leg test on the right side.  Osteopathic findings  C2 flexed rotated and side bent right C6 flexed rotated and side bent left T3 extended rotated and side bent right inhaled rib T9 extended rotated and side bent left L3 flexed rotated and side bent right Sacrum right on right     Assessment and Plan:  Low back pain Low back pain that could have been exacerbated from some of the other exacerbation of similar activities.  Discussed icing regimen and home exercises, increase activity slowly.  Will continue to monitor.  Patient is medication averse and we will continue to monitor.  Follow-up again in 6 to 8 weeks    Nonallopathic problems  Decision today to treat with OMT was based on Physical Exam  After verbal consent patient was treated with HVLA, ME, FPR techniques in cervical, rib, thoracic, lumbar, and sacral  areas noted HVLA on the neck  Patient tolerated the procedure well with improvement in symptoms  Patient given exercises, stretches and lifestyle modifications  See medications in patient instructions if given  Patient will follow up in 4-8 weeks    The above documentation has been reviewed and is accurate and complete Arthea CHRISTELLA Claudene, DO          Note: This dictation was prepared with Dragon dictation along with smaller phrase  technology. Any transcriptional errors that result from this process are unintentional.

## 2024-02-07 ENCOUNTER — Ambulatory Visit: Admitting: Family Medicine

## 2024-02-07 ENCOUNTER — Encounter: Payer: Self-pay | Admitting: Family Medicine

## 2024-02-07 VITALS — BP 110/68 | HR 69 | Ht 60.0 in

## 2024-02-07 DIAGNOSIS — M9908 Segmental and somatic dysfunction of rib cage: Secondary | ICD-10-CM

## 2024-02-07 DIAGNOSIS — M9903 Segmental and somatic dysfunction of lumbar region: Secondary | ICD-10-CM

## 2024-02-07 DIAGNOSIS — M9904 Segmental and somatic dysfunction of sacral region: Secondary | ICD-10-CM | POA: Diagnosis not present

## 2024-02-07 DIAGNOSIS — M9901 Segmental and somatic dysfunction of cervical region: Secondary | ICD-10-CM

## 2024-02-07 DIAGNOSIS — M9902 Segmental and somatic dysfunction of thoracic region: Secondary | ICD-10-CM

## 2024-02-07 DIAGNOSIS — M545 Low back pain, unspecified: Secondary | ICD-10-CM

## 2024-02-07 DIAGNOSIS — G8929 Other chronic pain: Secondary | ICD-10-CM

## 2024-02-07 NOTE — Assessment & Plan Note (Signed)
 Low back pain that could have been exacerbated from some of the other exacerbation of similar activities.  Discussed icing regimen and home exercises, increase activity slowly.  Will continue to monitor.  Patient is medication averse and we will continue to monitor.  Follow-up again in 6 to 8 weeks

## 2024-02-07 NOTE — Patient Instructions (Signed)
 Good to see you! See you again in 2 months

## 2024-04-10 NOTE — Progress Notes (Unsigned)
  [name] [address] [place] [address] Phone: [phone] Subjective:   LILLETTE Berwyn Posey, am serving as a scribe for Dr. Arthea Claudene.  I'm seeing this patient by the request  of:  Alvia Bring, DO  CC: Back and neck pain follow-up  [name]  Andrea [name] is a 53 y.o. female coming in with complaint of back and neck pain. OMT 02/07/2024. Patient states that she is doing well since last visit. Nothing new.   Medications patient has been prescribed: None  Taking:         Reviewed prior external information including notes and imaging from previsou exam, outside providers and external EMR if available.   As well as notes that were available from care everywhere and other healthcare systems.  Past medical history, social, surgical and family history all reviewed in electronic medical record.  No pertanent information unless stated regarding to the chief complaint.   Past Medical History:  Diagnosis Date   Abnormal Pap smear 2008   LGSIL   Arthritis of left hip    saw Dr. Gaspar, had MRI/had sx to fix-   Headache    Osteopenia    Seasonal allergies     Allergies  Allergen Reactions   Vicks Nyquil Cold & Flu Night [Dm-Apap-Cpm] Other (See Comments)    Heavy, tight chest     Review of Systems:  No headache, visual changes, nausea, vomiting, diarrhea, constipation, dizziness, abdominal pain, skin rash, fevers, chills, night sweats, weight loss, swollen lymph nodes, body aches, joint swelling, chest pain, shortness of breath, mood changes. POSITIVE muscle aches  Objective  Blood pressure 112/70, pulse 73, height 5' (1.524 m), SpO2 96%.   General: No apparent distress alert and oriented x3 mood and affect normal, dressed appropriately.  HEENT: Pupils equal, extraocular movements intact  Respiratory: Patient's speak in full sentences and does not appear short of breath  Cardiovascular: No lower extremity edema, non  tender, no erythema  Gait MSK:  Back does have some mild loss lordosis.  Still tightness noted in the paraspinal musculature around the right hip.  Osteopathic findings  C6 flexed rotated and side bent left T3 extended rotated and side bent right inhaled rib T9 extended rotated and side bent left L2 flexed rotated and side bent right L3 flexed rotated and side bent left Sacrum right on right       Assessment and Plan:  No problem-specific Assessment & Plan notes found for this encounter.    Nonallopathic problems  Decision today to treat with OMT was based on Physical Exam  After verbal consent patient was treated with HVLA, ME, FPR techniques in cervical, rib, thoracic, lumbar, and sacral  areas  Patient tolerated the procedure well with improvement in symptoms  Patient given exercises, stretches and lifestyle modifications  See medications in patient instructions if given  Patient will follow up in 4-8 weeks    The above documentation has been reviewed and is accurate and complete [name] M [name], DO          Note: This dictation was prepared with Dragon dictation along with smaller phrase technology. Any transcriptional errors that result from this process are unintentional.

## 2024-04-11 ENCOUNTER — Encounter: Payer: Self-pay | Admitting: Family Medicine

## 2024-04-11 ENCOUNTER — Ambulatory Visit: Admitting: Family Medicine

## 2024-04-11 VITALS — BP 112/70 | HR 73 | Ht 60.0 in

## 2024-04-11 DIAGNOSIS — M9908 Segmental and somatic dysfunction of rib cage: Secondary | ICD-10-CM | POA: Diagnosis not present

## 2024-04-11 DIAGNOSIS — G8929 Other chronic pain: Secondary | ICD-10-CM | POA: Diagnosis not present

## 2024-04-11 DIAGNOSIS — M9903 Segmental and somatic dysfunction of lumbar region: Secondary | ICD-10-CM | POA: Diagnosis not present

## 2024-04-11 DIAGNOSIS — M9902 Segmental and somatic dysfunction of thoracic region: Secondary | ICD-10-CM

## 2024-04-11 DIAGNOSIS — M9904 Segmental and somatic dysfunction of sacral region: Secondary | ICD-10-CM

## 2024-04-11 DIAGNOSIS — M545 Low back pain, unspecified: Secondary | ICD-10-CM

## 2024-04-11 DIAGNOSIS — M9901 Segmental and somatic dysfunction of cervical region: Secondary | ICD-10-CM

## 2024-04-11 NOTE — Assessment & Plan Note (Signed)
 Has been doing relatively well.  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  Patient has done very well with strengthening and conditioning.  Follow-up again in 6 to 12 weeks

## 2024-05-22 NOTE — Progress Notes (Unsigned)
 [name] [address] [place] [address] Phone: [phone] Subjective:   LILLETTE Berwyn Posey, am serving as a scribe for Dr. Arthea Claudene.  I'm seeing this patient by the request  of:  Alvia Bring, DO  CC: Neck and back pain follow-up  Andrea [name]  [name] is a 53 y.o. female coming in with complaint of back and neck pain. OMT 04/11/2024. Patient states that she is having pain in both sides of neck near base of skull. Headache for past 2 days.   Medications patient has been prescribed: None  Taking:         Reviewed prior external information including notes and imaging from previsou exam, outside providers and external EMR if available.   As well as notes that were available from care everywhere and other healthcare systems.  Past medical history, social, surgical and family history all reviewed in electronic medical record.  No pertanent information unless stated regarding to the chief complaint.   Past Medical History:  Diagnosis Date   Abnormal Pap smear 2008   LGSIL   Arthritis of left hip    saw Dr. Gaspar, had MRI/had sx to fix-   Headache    Osteopenia    Seasonal allergies     Allergies  Allergen Reactions   Vicks Nyquil Cold & Flu Night [Dm-Apap-Cpm] Other (See Comments)    Heavy, tight chest     Review of Systems:  No headache, visual changes, nausea, vomiting, diarrhea, constipation, dizziness, abdominal pain, skin rash, fevers, chills, night sweats, weight loss, swollen lymph nodes, body aches, joint swelling, chest pain, shortness of breath, mood changes. POSITIVE muscle aches  Objective  Blood pressure 112/64, pulse 71, height 5' (1.524 m), SpO2 98%.   General: No apparent distress alert and oriented x3 mood and affect normal, dressed appropriately.  HEENT: Pupils equal, extraocular movements intact  Respiratory: Patient's speak in full sentences and does not appear short of breath   Cardiovascular: No lower extremity edema, non tender, no erythema  Gait normal MSK:  Back does have some loss of lordosis and tightness noted with FABER right greater than left.  Negative straight leg test noted.  Tightness noted in the parascapular area.  Osteopathic findings  C2 flexed rotated and side bent right C7 flexed rotated and side bent left T5 extended rotated and side bent right inhaled rib T7 extended rotated and side bent left L2 flexed rotated and side bent right L3 flexed rotated and side bent left Sacrum right on right Did we get paid this week about his last week yeah yeah   Assessment and Plan:  Low back pain Low back does have some loss of lordosis noted.  Tenderness to palpation in the paraspinal musculature.  Moves relatively well.  Is continuing to increase activity slowly.  Discussed icing regimen and home exercises, follow-up again in 6 to 8 weeks.    Nonallopathic problems  Decision today to treat with OMT was based on Physical Exam  After verbal consent patient was treated with  ME, FPR techniques in cervical, rib, thoracic, lumbar, and sacral  areas  Patient tolerated the procedure well with improvement in symptoms  Patient given exercises, stretches and lifestyle modifications  See medications in patient instructions if given  Patient will follow up in 4-8 weeks     The above documentation has been reviewed and is accurate and complete [name] M [name], DO         Note: This  dictation was prepared with Dragon dictation along with smaller phrase technology. Any transcriptional errors that result from this process are unintentional.

## 2024-05-23 ENCOUNTER — Encounter: Payer: Self-pay | Admitting: Family Medicine

## 2024-05-23 ENCOUNTER — Ambulatory Visit: Admitting: Family Medicine

## 2024-05-23 VITALS — BP 112/64 | HR 71 | Ht 60.0 in

## 2024-05-23 DIAGNOSIS — M9908 Segmental and somatic dysfunction of rib cage: Secondary | ICD-10-CM

## 2024-05-23 DIAGNOSIS — M9903 Segmental and somatic dysfunction of lumbar region: Secondary | ICD-10-CM | POA: Diagnosis not present

## 2024-05-23 DIAGNOSIS — G8929 Other chronic pain: Secondary | ICD-10-CM | POA: Diagnosis not present

## 2024-05-23 DIAGNOSIS — M9901 Segmental and somatic dysfunction of cervical region: Secondary | ICD-10-CM | POA: Diagnosis not present

## 2024-05-23 DIAGNOSIS — M545 Low back pain, unspecified: Secondary | ICD-10-CM | POA: Diagnosis not present

## 2024-05-23 DIAGNOSIS — M9902 Segmental and somatic dysfunction of thoracic region: Secondary | ICD-10-CM

## 2024-05-23 DIAGNOSIS — M9904 Segmental and somatic dysfunction of sacral region: Secondary | ICD-10-CM

## 2024-05-23 NOTE — Patient Instructions (Signed)
 Great to see you Enjoy horse cochella Watch sinuses Jessica Outlaw Back in Balance See me again in 6-8 weeks

## 2024-05-23 NOTE — Assessment & Plan Note (Signed)
 Low back does have some loss of lordosis noted.  Tenderness to palpation in the paraspinal musculature.  Moves relatively well.  Is continuing to increase activity slowly.  Discussed icing regimen and home exercises, follow-up again in 6 to 8 weeks.

## 2024-06-28 ENCOUNTER — Encounter: Payer: Self-pay | Admitting: Gastroenterology

## 2024-06-28 ENCOUNTER — Ambulatory Visit (AMBULATORY_SURGERY_CENTER): Admitting: *Deleted

## 2024-06-28 VITALS — Ht 60.0 in | Wt 130.0 lb

## 2024-06-28 DIAGNOSIS — Z800 Family history of malignant neoplasm of digestive organs: Secondary | ICD-10-CM

## 2024-06-28 DIAGNOSIS — Z860100 Personal history of colon polyps, unspecified: Secondary | ICD-10-CM

## 2024-06-28 MED ORDER — NA SULFATE-K SULFATE-MG SULF 17.5-3.13-1.6 GM/177ML PO SOLN
1.0000 | Freq: Once | ORAL | 0 refills | Status: AC
Start: 2024-06-28 — End: 2024-06-28

## 2024-06-28 NOTE — Progress Notes (Signed)
 Pre visit completed over telephone. Instructions forwarded through MyChart.  No egg or soy allergy known to patient  No issues known to pt with past sedation with any surgeries or procedures Patient denies ever being told they had issues or difficulty with intubation  No FH of Malignant Hyperthermia Pt is not on diet pills Pt is not on  home 02  Pt is not on blood thinners  Pt denies issues with constipation  No A fib or A flutter Have any cardiac testing pending-- NO Pt instructed to use Single[employer][email or web address] or GoodRx for a price reduction on prep    Chart previously reviewed by DOROTHA Schillings, CRNA

## 2024-07-08 NOTE — Progress Notes (Unsigned)
 [name] [address] [place] [address] Phone: [phone] Subjective:   Andrea [name] Andrea [name], am serving as a scribe for Dr. Arthea Claudene.  I'Andrea seeing this patient by the request  of:  Alvia Bring, DO  CC: Back pain follow-up  [name]  Andrea [name] is a 53 y.o. female coming in with complaint of back and neck pain. OMT on 05/23/2024. Patient states that she is doing well. Pain in back of R leg when she contract glute. Did have some pain that radiated into her foot but this has resolved.   Medications patient has been prescribed:   Taking:         Reviewed prior external information including notes and imaging from previsou exam, outside providers and external EMR if available.   As well as notes that were available from care everywhere and other healthcare systems.  Past medical history, social, surgical and family history all reviewed in electronic medical record.  No pertanent information unless stated regarding to the chief complaint.   Past Medical History:  Diagnosis Date   Abnormal Pap smear 2008   LGSIL   Arthritis of left hip    saw Dr. Gaspar, had MRI/had sx to fix-   Headache    Osteopenia    Seasonal allergies     Allergies  Allergen Reactions   Vicks Nyquil Cold & Flu Night [Dm-Apap-Cpm] Other (See Comments)    Heavy, tight chest     Review of Systems:  No headache, visual changes, nausea, vomiting, diarrhea, constipation, dizziness, abdominal pain, skin rash, fevers, chills, night sweats, weight loss, swollen lymph nodes, body aches, joint swelling, chest pain, shortness of breath, mood changes. POSITIVE muscle aches  Objective  Blood pressure 112/76, pulse 76, height 5' (1.524 Andrea), weight 135 lb (61.2 kg), SpO2 98%.   General: No apparent distress alert and oriented x3 mood and affect normal, dressed appropriately.  HEENT: Pupils equal, extraocular movements intact  Respiratory: Patient's speak in  full sentences and does not appear short of breath  Cardiovascular: No lower extremity edema, non tender, no erythema  Gait MSK:  Back severe discomfort over the right sacroiliac joint.  Some tenderness over the gluteal tendon as well proximally.  Minimal discomfort over the greater trochanteric area on the right side.  Left hip is unremarkable.  Osteopathic findings  C3 flexed rotated and side bent right C6 flexed rotated and side bent left T3 extended rotated and side bent right inhaled rib L2 flexed rotated and side bent right L5 flexed rotated and side bent right Sacrum right on right       Assessment and Plan:  Low back pain Low back does have loss lordosis.  Discussed home exercises as well.  Patient does have some gluteal discomfort as well.  Patient was doing a lot of horseback riding that I think it contributed.  Follow-up again in 6 to 8 weeks    Nonallopathic problems  Decision today to treat with OMT was based on Physical Exam  After verbal consent patient was treated with HVLA, ME, FPR techniques in cervical, rib, thoracic, lumbar, and sacral  areas  Patient tolerated the procedure well with improvement in symptoms  Patient given exercises, stretches and lifestyle modifications  See medications in patient instructions if given  Patient will follow up in 4-8 weeks     The above documentation has been reviewed and is accurate and complete Andrea [name] Andrea Coben Godshall, DO  Note: This dictation was prepared with Dragon dictation along with smaller phrase technology. Any transcriptional errors that result from this process are unintentional.

## 2024-07-10 ENCOUNTER — Ambulatory Visit: Admitting: Family Medicine

## 2024-07-10 VITALS — BP 112/76 | HR 76 | Ht 60.0 in | Wt 135.0 lb

## 2024-07-10 DIAGNOSIS — M9902 Segmental and somatic dysfunction of thoracic region: Secondary | ICD-10-CM

## 2024-07-10 DIAGNOSIS — M9908 Segmental and somatic dysfunction of rib cage: Secondary | ICD-10-CM

## 2024-07-10 DIAGNOSIS — M9903 Segmental and somatic dysfunction of lumbar region: Secondary | ICD-10-CM

## 2024-07-10 DIAGNOSIS — M9904 Segmental and somatic dysfunction of sacral region: Secondary | ICD-10-CM

## 2024-07-10 DIAGNOSIS — M9901 Segmental and somatic dysfunction of cervical region: Secondary | ICD-10-CM

## 2024-07-10 DIAGNOSIS — G8929 Other chronic pain: Secondary | ICD-10-CM

## 2024-07-10 NOTE — Assessment & Plan Note (Signed)
 Low back does have loss lordosis.  Discussed home exercises as well.  Patient does have some gluteal discomfort as well.  Patient was doing a lot of horseback riding that I think it contributed.  Follow-up again in 6 to 8 weeks

## 2024-07-10 NOTE — Patient Instructions (Signed)
 Good to see you. SI mobilization exercises. See me again in 6 to 8 weeks.

## 2024-07-12 ENCOUNTER — Ambulatory Visit: Admitting: Gastroenterology

## 2024-07-12 ENCOUNTER — Encounter: Payer: Self-pay | Admitting: Gastroenterology

## 2024-07-12 VITALS — BP 94/57 | HR 77 | Temp 98.4°F | Resp 15 | Ht 60.0 in | Wt 130.0 lb

## 2024-07-12 DIAGNOSIS — Z800 Family history of malignant neoplasm of digestive organs: Secondary | ICD-10-CM

## 2024-07-12 DIAGNOSIS — K5989 Other specified functional intestinal disorders: Secondary | ICD-10-CM | POA: Diagnosis not present

## 2024-07-12 DIAGNOSIS — Z860100 Personal history of colon polyps, unspecified: Secondary | ICD-10-CM

## 2024-07-12 MED ORDER — SODIUM CHLORIDE 0.9 % IV SOLN: 500.0000 mL | Freq: Once | INTRAVENOUS | Status: DC

## 2024-07-12 NOTE — Op Note (Signed)
 [facility] Patient Name: Andrea [name] Procedure Date: 07/12/2024 9:01 AM MRN: [id number] Endoscopist: Sandor Flatter , MD, [id number] Age: 53 Referring MD:  Date of Birth: 03-14-1971 Gender: Female Account #: [id number] Procedure:                Colonoscopy Indications:              Surveillance: Personal history of adenomatous                            polyps on last colonoscopy 3 years ago                           Fhx notable for mother with colon cancer age 21,                            along with MGM and maternal aunt with CRC. Father                            with polyps.                           Last colonoscopy was 05/2021 and notable for 3                            subcentimeter polyps resected from the cecum (all                            adenomas) with recommendation to repeat in 3 years. Medicines:                Monitored Anesthesia Care Procedure:                Pre-Anesthesia Assessment:                           - Prior to the procedure, a History and Physical                            was performed, and patient medications and                            allergies were reviewed. The patient's tolerance of                            previous anesthesia was also reviewed. The risks                            and benefits of the procedure and the sedation                            options and risks were discussed with the patient.                            All questions were answered, and informed consent  was obtained. Prior Anticoagulants: The patient has                            taken no anticoagulant or antiplatelet agents. ASA                            Grade Assessment: I - A normal, healthy patient.                            After reviewing the risks and benefits, the patient                            was deemed in satisfactory condition to undergo the                            procedure.                            After obtaining informed consent, the colonoscope                            was passed under direct vision. Throughout the                            procedure, the patient's blood pressure, pulse, and                            oxygen saturations were monitored continuously. The                            Olympus Scope SN [id number] was introduced through the                            anus and advanced to the the terminal ileum. The                            colonoscopy was performed without difficulty. The                            patient tolerated the procedure well. The quality                            of the bowel preparation was good. The terminal                            ileum, ileocecal valve, appendiceal orifice, and                            rectum were photographed. Scope In: 9:11:39 AM Scope Out: 9:31:05 AM Scope Withdrawal Time: 0 hours 14 minutes 32 seconds  Total Procedure Duration: 0 hours 19 minutes 26 seconds  Findings:                 The perianal and digital rectal examinations were  normal.                           A localized area of mildly erythematous mucosa was                            found in the distal rectum, limited to the distal 2                            cm of the rectum. Biopsies were taken with a cold                            forceps for histology. Estimated blood loss was                            minimal.                           The exam was otherwise normal throughout the                            remainder of the colon.                           The terminal ileum appeared normal. Complications:            No immediate complications. Estimated Blood Loss:     Estimated blood loss was minimal. Impression:               - Mildly erythematous mucosa in the distal 2 cm of                            the rectum. Biopsied.                           - The remainder of the colon was otherwise normal                             appearing.                           - The examined portion of the ileum was normal. Recommendation:           - Patient has a contact number available for                            emergencies. The signs and symptoms of potential                            delayed complications were discussed with the                            patient. Return to normal activities tomorrow.                            Written discharge instructions were provided to the  patient.                           - Resume previous diet.                           - Continue present medications.                           - Await pathology results.                           - Repeat colonoscopy in 5 years for surveillance.                           - Return to GI office PRN. Sandor Flatter, MD 07/12/2024 9:36:28 AM

## 2024-07-12 NOTE — Progress Notes (Signed)
 Called to room to assist during endoscopic procedure.  Patient ID and intended procedure confirmed with present staff. Received instructions for my participation in the procedure from the performing physician.

## 2024-07-12 NOTE — Progress Notes (Signed)
 GASTROENTEROLOGY PROCEDURE H&P NOTE   Primary Care Physician: Alvia Bring, DO    Reason for Procedure:  Colon polyp surveillance  Plan:    Colonoscopy  Patient is appropriate for endoscopic procedure(s) in the ambulatory ([facility]) setting.  The nature of the procedure, as well as the risks, benefits, and alternatives were carefully and thoroughly reviewed with the patient. Ample time for discussion and questions allowed. The patient understood, was satisfied, and agreed to proceed. I personally addressed all patient questions and concerns.     HPI: Andrea [name] is a 53 y.o. female who presents for colonoscopy for ongoing colon polyp surveillance and colon cancer screening.  No active GI symptoms.  Patient is otherwise without complaints or active issues today.  Fhx notable for mother with colon cancer age 81, along with MGM and maternal aunt with CRC. Father with polyps.   Last colonoscopy was 05/2021 and notable for 3 subcentimeter polyps resected from the cecum (all adenomas) with recommendation to repeat in 3 years.  Past Medical History:  Diagnosis Date   Abnormal Pap smear 2008   LGSIL   Arthritis of left hip    saw Dr. Gaspar, had MRI/had sx to fix-   Headache    Osteopenia    Seasonal allergies     Past Surgical History:  Procedure Laterality Date   BREAST ENHANCEMENT SURGERY Bilateral 1998   BREAST SURGERY Right 01/2007   revision on breast due to tight muscles   CESAREAN SECTION  1995   NASAL SEPTOPLASTY W/ TURBINOPLASTY  03/2019   TOTAL HIP ARTHROPLASTY Left 2022   TUBAL LIGATION  2008   VAGINA RECONSTRUCTION SURGERY  10/2013   Vaginal rejuvination surgery, Dr. Elner Garden, [place]- due to muscle tear   WISDOM TOOTH EXTRACTION      Prior to Admission medications   Medication Sig Start Date End Date Taking? Authorizing Provider  Ascorbic Acid (VITAMIN C) 1000 MG tablet Take 1,000 mg by mouth daily.    [provider]  EPINEPHrine 0.3  mg/0.3 mL IJ SOAJ injection See admin instructions. 11/07/19   [provider]  GLYCINE PO Take 1,000 mg by mouth daily.    [provider]  levocetirizine (XYZAL) 5 MG tablet Take 5 mg by mouth every evening. 01/15/16   [provider]  MAGNESIUM GLYCINATE PO Take 800 mg by mouth daily.    [provider]  Multiple Vitamins-Minerals (MULTIVITAMIN PO) Take 1 tablet by mouth daily.    [provider]  Omega-3 Fatty Acids (FISH OIL PO) Take 1 capsule by mouth daily at 6 (six) AM.    [provider]  Prasterone, DHEA, (DHEA PO) Take 15 mg by mouth daily.    [provider]  Probiotic Product (PROBIOTIC PO) Take 1 capsule by mouth daily at 6 (six) AM.    [provider]  thyroid (ARMOUR) 90 MG tablet Take 90 mg by mouth daily.    [provider]  tretinoin (RETIN-A) 0.1 % cream Apply 1 application topically daily. 12/16/20   [provider]  zinc gluconate 50 MG tablet Take 50 mg by mouth every other day.    [provider]    Current Outpatient Medications  Medication Sig Dispense Refill   Ascorbic Acid (VITAMIN C) 1000 MG tablet Take 1,000 mg by mouth daily.     EPINEPHrine 0.3 mg/0.3 mL IJ SOAJ injection See admin instructions.     GLYCINE PO Take 1,000 mg by mouth daily.  levocetirizine (XYZAL) 5 MG tablet Take 5 mg by mouth every evening.     MAGNESIUM GLYCINATE PO Take 800 mg by mouth daily.     Multiple Vitamins-Minerals (MULTIVITAMIN PO) Take 1 tablet by mouth daily.     Omega-3 Fatty Acids (FISH OIL PO) Take 1 capsule by mouth daily at 6 (six) AM.     Prasterone, DHEA, (DHEA PO) Take 15 mg by mouth daily.     Probiotic Product (PROBIOTIC PO) Take 1 capsule by mouth daily at 6 (six) AM.     thyroid (ARMOUR) 90 MG tablet Take 90 mg by mouth daily.     tretinoin (RETIN-A) 0.1 % cream Apply 1 application topically daily.     zinc gluconate 50 MG tablet Take 50 mg by mouth every other day.      No current facility-administered medications for this visit.    Allergies as of 07/12/2024 - Review Complete 07/12/2024  Allergen Reaction Noted   Vicks nyquil cold & flu night [dm-apap-cpm] Other (See Comments) 10/21/2020    Family History  Problem Relation Age of Onset   Colon polyps Mother 80   Colon cancer Mother 63   Colon polyps Father 74   Colon cancer Maternal Aunt    Colon polyps Maternal Grandmother 44   Colon cancer Maternal Grandmother 85   Congestive Heart Failure Maternal Grandfather    Colon polyps Maternal Grandfather    Diabetes Paternal Grandmother    Hypertension Paternal Grandmother    Stroke Paternal Grandfather    Esophageal cancer Neg Hx    Stomach cancer Neg Hx    Rectal cancer Neg Hx     Social History   Socioeconomic History   Marital status: Married    Spouse name: Not on file   Number of children: Not on file   Years of education: Not on file   Highest education level: Not on file  Occupational History   Not on file  Tobacco Use   Smoking status: Never   Smokeless tobacco: Never  Vaping Use   Vaping status: Never Used  Substance and Sexual Activity   Alcohol use: Not Currently   Drug use: No   Sexual activity: Yes    Partners: Male    Birth control/protection: Surgical    Comment: BTL  Other Topics Concern   Not on file  Social History Narrative   Not on file   Social Drivers of Health   Financial Resource Strain: Not on file  Food Insecurity: Not on file  Transportation Needs: Not on file  Physical Activity: Not on file  Stress: Not on file  Social Connections: Not on file  Intimate Partner Violence: Not on file    Physical Exam: Vital signs in last 24 hours: @There  were no vitals taken for this visit. GEN: NAD EYE: Sclerae anicteric ENT: MMM CV: Non-tachycardic Pulm: CTA b/l GI: Soft, NT/ND NEURO:  Alert & Oriented x 3   Sandor Flatter, DO [facility]   07/12/2024 8:06 AM

## 2024-07-12 NOTE — Progress Notes (Signed)
 Sedate, gd SR, tolerated procedure well, VSS, report to RN

## 2024-07-12 NOTE — Patient Instructions (Signed)
 Await pathology results.  YOU HAD AN ENDOSCOPIC PROCEDURE TODAY AT THE [facility]:   Refer to the procedure report that was given to you for any specific questions about what was found during the examination.  If the procedure report does not answer your questions, please call your gastroenterologist to clarify.  If you requested that your care partner not be given the details of your procedure findings, then the procedure report has been included in a sealed envelope for you to review at your convenience later.  YOU SHOULD EXPECT: Some feelings of bloating in the abdomen. Passage of more gas than usual.  Walking can help get rid of the air that was put into your GI tract during the procedure and reduce the bloating. If you had a lower endoscopy (such as a colonoscopy or flexible sigmoidoscopy) you may notice spotting of blood in your stool or on the toilet paper. If you underwent a bowel prep for your procedure, you may not have a normal bowel movement for a few days.  Please Note:  You might notice some irritation and congestion in your nose or some drainage.  This is from the oxygen used during your procedure.  There is no need for concern and it should clear up in a day or so.  SYMPTOMS TO REPORT IMMEDIATELY:  Following lower endoscopy (colonoscopy or flexible sigmoidoscopy):  Excessive amounts of blood in the stool  Significant tenderness or worsening of abdominal pains  Swelling of the abdomen that is new, acute  Fever of 100F or higher  For urgent or emergent issues, a gastroenterologist can be reached at any hour by calling [phone]. Do not use MyChart messaging for urgent concerns.    DIET:  We do recommend a small meal at first, but then you may proceed to your regular diet.  Drink plenty of fluids but you should avoid alcoholic beverages for 24 hours.  ACTIVITY:  You should plan to take it easy for the rest of today and you should NOT DRIVE or use heavy machinery  until tomorrow (because of the sedation medicines used during the test).    FOLLOW UP: Our staff will call the number listed on your records the next business day following your procedure.  We will call around 7:15- 8:00 am to check on you and address any questions or concerns that you may have regarding the information given to you following your procedure. If we do not reach you, we will leave a message.     If any biopsies were taken you will be contacted by phone or by letter within the next 1-3 weeks.  Please call us at [phone] if you have not heard about the biopsies in 3 weeks.    SIGNATURES/CONFIDENTIALITY: You and/or your care partner have signed paperwork which will be entered into your electronic medical record.  These signatures attest to the fact that that the information above on your After Visit Summary has been reviewed and is understood.  Full responsibility of the confidentiality of this discharge information lies with you and/or your care-partner.  

## 2024-07-15 ENCOUNTER — Telehealth: Payer: Self-pay

## 2024-07-15 NOTE — Telephone Encounter (Signed)
  Follow up Call-     07/12/2024    8:05 AM  Call back number  Post procedure Call Back phone  # [phone]  Permission to leave phone message Yes     Patient questions:  Do you have a fever, pain , or abdominal swelling? Yes.   Pain Score  0 *  Have you tolerated food without any problems? Yes.    Have you been able to return to your normal activities? Yes.    Do you have any questions about your discharge instructions: Diet   No. Medications  No. Follow up visit  No.  Do you have questions or concerns about your Care? No.  Actions: * If pain score is 4 or above: No action needed, pain <4.

## 2024-07-16 LAB — SURGICAL PATHOLOGY

## 2024-07-19 ENCOUNTER — Ambulatory Visit: Payer: Self-pay | Admitting: Gastroenterology

## 2024-08-27 NOTE — Progress Notes (Unsigned)
 " [name] [address] [place] [address] Phone: [phone] Subjective:   Andrea [name], am serving as a scribe for Dr. Arthea Claudene.  I'm seeing this patient by the request  of:  Alvia Bring, DO  CC: Back and neck pain follow-up  [name]  Andrea [name] is a 53 y.o. female coming in with complaint of back and neck pain. OMT 07/10/2024. Patient states same per usual. No new symptoms.  Medications patient has been prescribed: None  Taking:         Reviewed prior external information including notes and imaging from previsou exam, outside providers and external EMR if available.   As well as notes that were available from care everywhere and other healthcare systems.  Past medical history, social, surgical and family history all reviewed in electronic medical record.  No pertanent information unless stated regarding to the chief complaint.   Past Medical History:  Diagnosis Date   Abnormal Pap smear 2008   LGSIL   Arthritis of left hip    saw Dr. Gaspar, had MRI/had sx to fix-   Headache    Osteopenia    Seasonal allergies     Allergies[1]   Review of Systems:  No headache, visual changes, nausea, vomiting, diarrhea, constipation, dizziness, abdominal pain, skin rash, fevers, chills, night sweats, weight loss, swollen lymph nodes, body aches, joint swelling, chest pain, shortness of breath, mood changes. POSITIVE muscle aches  Objective  Blood pressure 114/74, pulse 77, height 5' (1.524 m), weight 137 lb (62.1 kg), SpO2 97%.   General: No apparent distress alert and oriented x3 mood and affect normal, dressed appropriately.  HEENT: Pupils equal, extraocular movements intact  Respiratory: Patient's speak in full sentences and does not appear short of breath  Cardiovascular: No lower extremity edema, non tender, no erythema  Gait relatively normal MSK:  Back back does have some mild loss lordosis.  Some  tenderness to palpation of the paraspinal musculature.  Tightness noted with FABER left side more than right.  Osteopathic findings  C2 flexed rotated and side bent right C6 flexed rotated and side bent left T3 extended rotated and side bent left inhaled rib T4 extended rotated and side bent left inhaled rib L2 flexed rotated and side bent right Sacrum left on left     Assessment and Plan:  Low back pain Low back does have some mild loss of lordosis.  Some tenderness to palpation of the paraspinal musculature.  Tightness noted in paraspinal musculature.  Follow-up again in 6 to 8 weeks    Nonallopathic problems  Decision today to treat with OMT was based on Physical Exam  After verbal consent patient was treated with HVLA, ME, FPR techniques in cervical, rib, thoracic, lumbar, and sacral  areas  Patient tolerated the procedure well with improvement in symptoms  Patient given exercises, stretches and lifestyle modifications  See medications in patient instructions if given  Patient will follow up in 4-8 weeks    The above documentation has been reviewed and is accurate and complete [place] Droll M Dugan Vanhoesen, DO          Note: This dictation was prepared with Dragon dictation along with smaller phrase technology. Any transcriptional errors that result from this process are unintentional.            [1]  Allergies Allergen Reactions   Vicks Nyquil Cold & Flu Night [Dm-Apap-Cpm] Other (See Comments)    Heavy, tight chest   "

## 2024-08-29 ENCOUNTER — Encounter: Payer: Self-pay | Admitting: Family Medicine

## 2024-08-29 ENCOUNTER — Ambulatory Visit: Admitting: Family Medicine

## 2024-08-29 VITALS — BP 114/74 | HR 77 | Ht 60.0 in | Wt 137.0 lb

## 2024-08-29 DIAGNOSIS — G8929 Other chronic pain: Secondary | ICD-10-CM

## 2024-08-29 DIAGNOSIS — M545 Low back pain, unspecified: Secondary | ICD-10-CM

## 2024-08-29 DIAGNOSIS — M9904 Segmental and somatic dysfunction of sacral region: Secondary | ICD-10-CM

## 2024-08-29 DIAGNOSIS — M9908 Segmental and somatic dysfunction of rib cage: Secondary | ICD-10-CM

## 2024-08-29 DIAGNOSIS — M9903 Segmental and somatic dysfunction of lumbar region: Secondary | ICD-10-CM

## 2024-08-29 DIAGNOSIS — M9901 Segmental and somatic dysfunction of cervical region: Secondary | ICD-10-CM | POA: Diagnosis not present

## 2024-08-29 DIAGNOSIS — M9902 Segmental and somatic dysfunction of thoracic region: Secondary | ICD-10-CM

## 2024-08-29 NOTE — Assessment & Plan Note (Signed)
 Low back does have some mild loss of lordosis.  Some tenderness to palpation of the paraspinal musculature.  Tightness noted in paraspinal musculature.  Follow-up again in 6 to 8 weeks

## 2024-09-30 ENCOUNTER — Encounter: Admitting: Family Medicine

## 2024-10-10 ENCOUNTER — Ambulatory Visit: Admitting: Family Medicine
